# Patient Record
Sex: Female | Born: 1995 | Race: White | Hispanic: No | Marital: Single | Smoking: Never smoker
Health system: Southern US, Community
[De-identification: ages and names within clinical notes are randomized; demographics above are authoritative.]

## PROBLEM LIST (undated history)

## (undated) DIAGNOSIS — E039 Hypothyroidism, unspecified: Secondary | ICD-10-CM

## (undated) DIAGNOSIS — E049 Nontoxic goiter, unspecified: Secondary | ICD-10-CM

## (undated) DIAGNOSIS — K589 Irritable bowel syndrome without diarrhea: Secondary | ICD-10-CM

## (undated) DIAGNOSIS — N8300 Follicular cyst of ovary, unspecified side: Secondary | ICD-10-CM

## (undated) DIAGNOSIS — N390 Urinary tract infection, site not specified: Secondary | ICD-10-CM

## (undated) DIAGNOSIS — N200 Calculus of kidney: Secondary | ICD-10-CM

## (undated) DIAGNOSIS — N912 Amenorrhea, unspecified: Secondary | ICD-10-CM

## (undated) DIAGNOSIS — IMO0002 Reserved for concepts with insufficient information to code with codable children: Secondary | ICD-10-CM

## (undated) DIAGNOSIS — N809 Endometriosis, unspecified: Secondary | ICD-10-CM

## (undated) DIAGNOSIS — I7300 Raynaud's syndrome without gangrene: Secondary | ICD-10-CM

## (undated) DIAGNOSIS — N920 Excessive and frequent menstruation with regular cycle: Secondary | ICD-10-CM

## (undated) DIAGNOSIS — R1310 Dysphagia, unspecified: Secondary | ICD-10-CM

## (undated) DIAGNOSIS — G8929 Other chronic pain: Secondary | ICD-10-CM

## (undated) DIAGNOSIS — R011 Cardiac murmur, unspecified: Secondary | ICD-10-CM

## (undated) DIAGNOSIS — R55 Syncope and collapse: Secondary | ICD-10-CM

## (undated) DIAGNOSIS — E282 Polycystic ovarian syndrome: Secondary | ICD-10-CM

## (undated) DIAGNOSIS — R102 Pelvic and perineal pain: Secondary | ICD-10-CM

## (undated) HISTORY — DX: Nontoxic goiter, unspecified: E04.9

## (undated) HISTORY — DX: Urinary tract infection, site not specified: N39.0

## (undated) HISTORY — DX: Endometriosis, unspecified: N80.9

## (undated) HISTORY — DX: Reserved for concepts with insufficient information to code with codable children: IMO0002

## (undated) HISTORY — DX: Cardiac murmur, unspecified: R01.1

## (undated) HISTORY — DX: Syncope and collapse: R55

## (undated) HISTORY — DX: Polycystic ovarian syndrome: E28.2

## (undated) HISTORY — DX: Follicular cyst of ovary, unspecified side: N83.00

## (undated) HISTORY — DX: Other chronic pain: G89.29

## (undated) HISTORY — DX: Pelvic and perineal pain: R10.2

## (undated) HISTORY — DX: Raynaud's syndrome without gangrene: I73.00

## (undated) HISTORY — PX: CHOLECYSTECTOMY: SHX55

## (undated) HISTORY — DX: Amenorrhea, unspecified: N91.2

## (undated) HISTORY — DX: Dysphagia, unspecified: R13.10

## (undated) HISTORY — PX: WISDOM TOOTH EXTRACTION: SHX21

## (undated) HISTORY — DX: Excessive and frequent menstruation with regular cycle: N92.0

## (undated) HISTORY — DX: Calculus of kidney: N20.0

---
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
## 2015-01-09 ENCOUNTER — Ambulatory Visit: Payer: Self-pay | Admitting: Internal Medicine

## 2015-03-14 ENCOUNTER — Other Ambulatory Visit: Payer: Self-pay | Admitting: Family Medicine

## 2015-03-14 DIAGNOSIS — K829 Disease of gallbladder, unspecified: Secondary | ICD-10-CM

## 2015-03-21 ENCOUNTER — Other Ambulatory Visit
Admission: RE | Admit: 2015-03-21 | Discharge: 2015-03-21 | Disposition: A | Discharge status: Home / Self Care | Payer: 59 | Source: Ambulatory Visit | Attending: Family Medicine | Admitting: Family Medicine

## 2015-03-21 DIAGNOSIS — Z3200 Encounter for pregnancy test, result unknown: Secondary | ICD-10-CM | POA: Insufficient documentation

## 2015-03-21 LAB — HCG, QUANTITATIVE, PREGNANCY

## 2015-03-22 ENCOUNTER — Ambulatory Visit
Admission: RE | Admit: 2015-03-22 | Discharge: 2015-03-22 | Disposition: A | Discharge status: Home / Self Care | Payer: 59 | Source: Ambulatory Visit | Attending: Family Medicine | Admitting: Family Medicine

## 2015-03-22 DIAGNOSIS — K829 Disease of gallbladder, unspecified: Secondary | ICD-10-CM | POA: Insufficient documentation

## 2015-03-22 MED ORDER — SINCALIDE 5 MCG IJ SOLR
0.0200 ug/kg | Freq: Once | INTRAMUSCULAR | Status: AC
  Administered 2015-03-22: 1.4 ug via INTRAVENOUS
  Filled 2015-03-22: qty 5

## 2015-03-22 MED ORDER — TECHNETIUM TC 99M MEBROFENIN IV KIT
5.3780 | PACK | Freq: Once | INTRAVENOUS | Status: AC | PRN
  Administered 2015-03-22: 5.378 via INTRAVENOUS

## 2015-04-05 ENCOUNTER — Emergency Department
Admission: EM | Admit: 2015-04-05 | Discharge: 2015-04-05 | Disposition: A | Discharge status: Home / Self Care | Payer: 59 | Attending: Emergency Medicine | Admitting: Emergency Medicine

## 2015-04-05 ENCOUNTER — Encounter: Payer: Self-pay | Admitting: Emergency Medicine

## 2015-04-05 ENCOUNTER — Other Ambulatory Visit: Payer: Self-pay

## 2015-04-05 DIAGNOSIS — G8929 Other chronic pain: Secondary | ICD-10-CM | POA: Insufficient documentation

## 2015-04-05 DIAGNOSIS — R079 Chest pain, unspecified: Secondary | ICD-10-CM | POA: Diagnosis present

## 2015-04-05 DIAGNOSIS — M940 Chondrocostal junction syndrome [Tietze]: Secondary | ICD-10-CM | POA: Insufficient documentation

## 2015-04-05 HISTORY — DX: Hypothyroidism, unspecified: E03.9

## 2015-04-05 LAB — TROPONIN I: Troponin I: <0.03 ng/mL (ref <0.031)

## 2015-04-05 LAB — BASIC METABOLIC PANEL
Anion gap: 10 (ref 5–15)
BUN: 11 mg/dL (ref 6–20)
CHLORIDE: 105 mmol/L (ref 101–111)
CO2: 25 mmol/L (ref 22–32)
Calcium: 9.5 mg/dL (ref 8.9–10.3)
Creatinine, Ser: 0.85 mg/dL (ref 0.44–1.00)
GFR calc Af Amer: >60 mL/min (ref >60)
GFR calc non Af Amer: >60 mL/min (ref >60)
Glucose, Bld: 94 mg/dL (ref 65–99)
POTASSIUM: 3.8 mmol/L (ref 3.5–5.1)
SODIUM: 140 mmol/L (ref 135–145)

## 2015-04-05 LAB — CBC
HCT: 42.1 % (ref 35.0–47.0)
Hemoglobin: 14.1 g/dL (ref 12.0–16.0)
MCH: 30.1 pg (ref 26.0–34.0)
MCHC: 33.4 g/dL (ref 32.0–36.0)
MCV: 90.1 fL (ref 80.0–100.0)
PLATELETS: 309 10*3/uL (ref 150–440)
RBC: 4.67 MIL/uL (ref 3.80–5.20)
RDW: 12.1 % (ref 11.5–14.5)
WBC: 8.6 10*3/uL (ref 3.6–11.0)

## 2015-04-05 NOTE — ED Notes (Signed)
MD at bedside. 

## 2015-04-05 NOTE — ED Notes (Signed)
PT in with co cp since this am with dizziness, recent dx of hypothyroidism was started on synthroid 4 weeks ago.

## 2015-04-05 NOTE — Discharge Instructions (Signed)
Your EKG, chest x-ray, and labs were reassuring today.  We believe your chest discomfort is caused by costochondritis.  Please read the included information and follow-up with your regular doctor as recommended.  She has been told not to take ibuprofen or related medications in the past (NSAIDs), try taking ibuprofen 600 mg by mouth 3 times a day with meals for 5 days.  This should help with the pain and inflammation.  Return to the emergency department with new or worsening symptoms that concern you.  Costochondritis Costochondritis is a condition in which the tissue (cartilage) that connects your ribs with your breastbone (sternum) becomes irritated. It causes pain in the chest and rib area. It usually goes away on its own over time. HOME CARE  Avoid activities that wear you out.  Do not strain your ribs. Avoid activities that use your:  Chest.  Belly.  Side muscles.  Put ice on the area for the first 2 days after the pain starts.  Put ice in a plastic bag.  Place a towel between your skin and the bag.  Leave the ice on for 20 minutes, 2-3 times a day.  Only take medicine as told by your doctor. GET HELP IF:  You have redness or puffiness (swelling) in the rib area.  Your pain does not go away with rest or medicine. GET HELP RIGHT AWAY IF:   Your pain gets worse.  You are very uncomfortable.  You have trouble breathing.  You cough up blood.  You start sweating or throwing up (vomiting).  You have a fever or lasting symptoms for more than 2-3 days.  You have a fever and your symptoms suddenly get worse. MAKE SURE YOU:   Understand these instructions.  Will watch your condition.  Will get help right away if you are not doing well or get worse. Document Released: 04/07/2008 Document Revised: 06/22/2013 Document Reviewed: 05/24/2013 [facility] Patient Information 2015 [place], [place]. This information is not intended to replace advice given to you by your health  care provider. Make sure you discuss any questions you have with your health care provider.  Chest Pain (Nonspecific) It is often hard to give a specific diagnosis for the cause of chest pain. There is always a chance that your pain could be related to something serious, such as a heart attack or a blood clot in the lungs. You need to follow up with your health care provider for further evaluation. CAUSES   Heartburn.  Pneumonia or bronchitis.  Anxiety or stress.  Inflammation around your heart (pericarditis) or lung (pleuritis or pleurisy).  A blood clot in the lung.  A collapsed lung (pneumothorax). It can develop suddenly on its own (spontaneous pneumothorax) or from trauma to the chest.  Shingles infection (herpes zoster virus). The chest wall is composed of bones, muscles, and cartilage. Any of these can be the source of the pain.  The bones can be bruised by injury.  The muscles or cartilage can be strained by coughing or overwork.  The cartilage can be affected by inflammation and become sore (costochondritis). DIAGNOSIS  Lab tests or other studies may be needed to find the cause of your pain. Your health care provider may have you take a test called an ambulatory electrocardiogram (ECG). An ECG records your heartbeat patterns over a 24-hour period. You may also have other tests, such as:  Transthoracic echocardiogram (TTE). During echocardiography, sound waves are used to evaluate how blood flows through your heart.  Transesophageal echocardiogram (  TEE).  Cardiac monitoring. This allows your health care provider to monitor your heart rate and rhythm in real time.  Holter monitor. This is a portable device that records your heartbeat and can help diagnose heart arrhythmias. It allows your health care provider to track your heart activity for several days, if needed.  Stress tests by exercise or by giving medicine that makes the heart beat faster. TREATMENT   Treatment  depends on what may be causing your chest pain. Treatment may include:  Acid blockers for heartburn.  Anti-inflammatory medicine.  Pain medicine for inflammatory conditions.  Antibiotics if an infection is present.  You may be advised to change lifestyle habits. This includes stopping smoking and avoiding alcohol, caffeine, and chocolate.  You may be advised to keep your head raised (elevated) when sleeping. This reduces the chance of acid going backward from your stomach into your esophagus. Most of the time, nonspecific chest pain will improve within 2-3 days with rest and mild pain medicine.  HOME CARE INSTRUCTIONS   If antibiotics were prescribed, take them as directed. Finish them even if you start to feel better.  For the next few days, avoid physical activities that bring on chest pain. Continue physical activities as directed.  Do not use any tobacco products, including cigarettes, chewing tobacco, or electronic cigarettes.  Avoid drinking alcohol.  Only take medicine as directed by your health care provider.  Follow your health care provider's suggestions for further testing if your chest pain does not go away.  Keep any follow-up appointments you made. If you do not go to an appointment, you could develop lasting (chronic) problems with pain. If there is any problem keeping an appointment, call to reschedule. SEEK MEDICAL CARE IF:   Your chest pain does not go away, even after treatment.  You have a rash with blisters on your chest.  You have a fever. SEEK IMMEDIATE MEDICAL CARE IF:   You have increased chest pain or pain that spreads to your arm, neck, jaw, back, or abdomen.  You have shortness of breath.  You have an increasing cough, or you cough up blood.  You have severe back or abdominal pain.  You feel nauseous or vomit.  You have severe weakness.  You faint.  You have chills. This is an emergency. Do not wait to see if the pain will go away. Get  medical help at once. Call your local emergency services (911 in [place] Do not drive yourself to the hospital. MAKE SURE YOU:   Understand these instructions.  Will watch your condition.  Will get help right away if you are not doing well or get worse. Document Released: 07/30/2005 Document Revised: 10/25/2013 Document Reviewed: 05/25/2008 [facility] Patient Information 2015 [place]. This information is not intended to replace advice given to you by your health care provider. Make sure you discuss any questions you have with your health care provider.

## 2015-04-05 NOTE — ED Provider Notes (Signed)
[facility] Emergency Department Provider Note  ____________________________________________  Time seen: Approximately 9:42 PM  I have reviewed the triage vital signs and the nursing notes.   HISTORY  Chief Complaint Chest Pain    HPI Victoria Becker is a 19 y.o. female with a history of hypothyroidism, chronic right upper quadrant abdominal pain with recent HIDA scan, and obesity who presents with sharp substernal chest pain since this morning.  She states that she has had these symptoms many times over the last 3 or 4 weeks.  She thought she should get it checked out today.  It is reproducible with palpation of the sternum.  She denies shortness of breath, nausea/vomiting, dysuria, and any bowel changes as well as fever/chills.  He describes the pain as severe when it is "acting up" and mild at baseline.   Past Medical History  Diagnosis Date  . Hypothyroidism     There are no active problems to display for this patient.   Past Surgical History  Procedure Laterality Date  . Appendectomy  10/2013    No current outpatient prescriptions on file.  Allergies Augmentin  History reviewed. No pertinent family history.  Social History History  Substance Use Topics  . Smoking status: Never Smoker   . Smokeless tobacco: Not on file  . Alcohol Use: No    Review of Systems Constitutional: No fever/chills Eyes: No visual changes. ENT: No sore throat. Cardiovascular: Substernal chest pain as described above Respiratory: Denies shortness of breath. Gastrointestinal: No abdominal pain.  No nausea, no vomiting.  No diarrhea.  No constipation. Genitourinary: Negative for dysuria. Musculoskeletal: Negative for back pain. Skin: Negative for rash. Neurological: Negative for headaches, focal weakness or numbness.  10-point ROS otherwise negative.  ____________________________________________   PHYSICAL EXAM:  VITAL SIGNS: ED Triage Vitals  Enc Vitals  Group     BP 04/05/15 2016 136/86 mmHg     Pulse Rate 04/05/15 2016 99     Resp 04/05/15 2016 18     Temp 04/05/15 2016 98.1 F (36.7 C)     Temp Source 04/05/15 2016 Oral     SpO2 04/05/15 2016 100 %     Weight 04/05/15 2016 150 lb (68.04 kg)     Height 04/05/15 2016  (1.448 m)     Head Cir --      Peak Flow --      Pain Score 04/05/15 2017 8     Pain Loc --      Pain Edu? --      Excl. in GC? --     Constitutional: Alert and oriented. Well appearing and in no acute distress. Eyes: Conjunctivae are normal. PERRL. EOMI. Head: Atraumatic. Nose: No congestion/rhinnorhea. Mouth/Throat: Mucous membranes are moist.  Oropharynx non-erythematous. Neck: No stridor.  Cardiovascular: Normal rate, regular rhythm. Grossly normal heart sounds.  Good peripheral circulation.  Reproducible left-sided sternal/costochondral chest tenderness. Respiratory: Normal respiratory effort.  No retractions. Lungs CTAB. Gastrointestinal: Soft and nontender. No distention. No abdominal bruits. No CVA tenderness. Musculoskeletal: No lower extremity tenderness nor edema.  No joint effusions. Neurologic:  Normal speech and language. No gross focal neurologic deficits are appreciated. Speech is normal. No gait instability. Skin:  Skin is warm, dry and intact. No rash noted. Psychiatric: Mood and affect are normal. Speech and behavior are normal.  ____________________________________________   LABS (all labs ordered are listed, but only abnormal results are displayed)  Labs Reviewed  CBC  BASIC METABOLIC PANEL  TROPONIN I  lab results are unremarkable ____________________________________________  EKG  ED ECG REPORT I, Debbe Crumble, the attending physician, personally viewed and interpreted this ECG.  Date: 04/05/2015 EKG Time: 20:28  Rate: 98 Rhythm: normal sinus rhythm QRS Axis: normal Intervals: normal ST/T Wave abnormalities: normal Conduction Disutrbances: none Narrative  Interpretation: unremarkable  ____________________________________________  RADIOLOGY (Recent study, not ordered today) Nm Hepato W/eject Fract  03/22/2015   CLINICAL DATA:  Three months of abdominal pain with associated nausea and fever ; history of appendectomy  EXAM: NUCLEAR MEDICINE HEPATOBILIARY IMAGING WITH GALLBLADDER EF  TECHNIQUE: Sequential images of the abdomen were obtained out to 60 minutes following intravenous administration of radiopharmaceutical. After slow intravenous infusion of 1.4 micrograms Cholecystokinin, gallbladder ejection fraction was determined.  RADIOPHARMACEUTICALS:  5.378 mCi Technetium-3844m Choletec IV  COMPARISON:  Right upper quadrant ultrasound of December 21, 2012  FINDINGS: There is adequate uptake of the radiopharmaceutical by the liver. The common bile duct visible by 10-15 minutes with bowel activity visible by 15 minutes. The gallbladder is clearly visible by 20 minutes. Following CCK administration the 45 minutes ejection fraction is 75%. No pain was reported with CCK administration. At 45 min, normal ejection fraction is greater than 40%.  IMPRESSION: Normal hepatobiliary scan with normal gallbladder ejection fraction.   Electronically Signed   By: David  SwazilandJordan M.D.   On: 03/22/2015 10:00    ____________________________________________   INITIAL IMPRESSION / ASSESSMENT AND PLAN / ED COURSE  Pertinent labs & imaging results that were available during my care of the patient were reviewed by me and considered in my medical decision making (see chart for details).  The patient's exam, history, and results are all reassuring that this does not represent ACS.  She is PERC negative.  We discussed costochondritis and recommended close outpatient follow-up.  She understands and agrees.  I gave my usual and customary return precautions.  ____________________________________________  FINAL CLINICAL IMPRESSION(S) / ED DIAGNOSES  Final diagnoses:  Acute  costochondritis      NEW MEDICATIONS STARTED DURING THIS VISIT:  There are no discharge medications for this patient.    Loleta Roseory Jette Lewan, MD 04/05/15 2314

## 2015-04-05 NOTE — ED Notes (Signed)
Pt presents with chest pain since this morning. Recently started on levothyroxine. Pt holding her chest and appears uncomfortable.

## 2015-07-31 ENCOUNTER — Encounter: Payer: Self-pay | Admitting: Obstetrics and Gynecology

## 2015-11-19 ENCOUNTER — Emergency Department: Payer: 59

## 2015-11-19 ENCOUNTER — Emergency Department
Admission: EM | Admit: 2015-11-19 | Discharge: 2015-11-19 | Disposition: A | Discharge status: Home / Self Care | Payer: 59 | Attending: Emergency Medicine | Admitting: Emergency Medicine

## 2015-11-19 ENCOUNTER — Encounter: Payer: Self-pay | Admitting: *Deleted

## 2015-11-19 DIAGNOSIS — R000 Tachycardia, unspecified: Secondary | ICD-10-CM | POA: Diagnosis not present

## 2015-11-19 DIAGNOSIS — R0602 Shortness of breath: Secondary | ICD-10-CM | POA: Diagnosis not present

## 2015-11-19 DIAGNOSIS — R0789 Other chest pain: Secondary | ICD-10-CM | POA: Diagnosis not present

## 2015-11-19 DIAGNOSIS — Z91040 Latex allergy status: Secondary | ICD-10-CM | POA: Diagnosis not present

## 2015-11-19 DIAGNOSIS — R109 Unspecified abdominal pain: Secondary | ICD-10-CM | POA: Diagnosis present

## 2015-11-19 LAB — URINALYSIS COMPLETE WITH MICROSCOPIC (ARMC ONLY)
BILIRUBIN URINE: NEGATIVE
Glucose, UA: NEGATIVE mg/dL
Hgb urine dipstick: NEGATIVE
LEUKOCYTES UA: NEGATIVE
Nitrite: NEGATIVE
PROTEIN: 30 mg/dL — AB
SPECIFIC GRAVITY, URINE: 1.027 (ref 1.005–1.030)
pH: 7 (ref 5.0–8.0)

## 2015-11-19 LAB — BASIC METABOLIC PANEL
ANION GAP: 7 (ref 5–15)
BUN: 8 mg/dL (ref 6–20)
CHLORIDE: 104 mmol/L (ref 101–111)
CO2: 29 mmol/L (ref 22–32)
Calcium: 10 mg/dL (ref 8.9–10.3)
Creatinine, Ser: 0.68 mg/dL (ref 0.44–1.00)
GFR calc Af Amer: >60 mL/min (ref >60)
GLUCOSE: 122 mg/dL — AB (ref 65–99)
Potassium: 3.8 mmol/L (ref 3.5–5.1)
Sodium: 140 mmol/L (ref 135–145)

## 2015-11-19 LAB — CBC WITH DIFFERENTIAL/PLATELET
BASOS ABS: 0 10*3/uL (ref 0–0.1)
Basophils Relative: 0 %
EOS PCT: 2 %
Eosinophils Absolute: 0.1 10*3/uL (ref 0–0.7)
HEMATOCRIT: 43.7 % (ref 35.0–47.0)
Hemoglobin: 14.9 g/dL (ref 12.0–16.0)
Lymphocytes Relative: 29 %
Lymphs Abs: 1.7 10*3/uL (ref 1.0–3.6)
MCH: 30.2 pg (ref 26.0–34.0)
MCHC: 34 g/dL (ref 32.0–36.0)
MCV: 88.8 fL (ref 80.0–100.0)
MONO ABS: 0.6 10*3/uL (ref 0.2–0.9)
Monocytes Relative: 10 %
NEUTROS ABS: 3.5 10*3/uL (ref 1.4–6.5)
Neutrophils Relative %: 59 %
Platelets: 286 10*3/uL (ref 150–440)
RBC: 4.92 MIL/uL (ref 3.80–5.20)
RDW: 12.4 % (ref 11.5–14.5)
WBC: 5.9 10*3/uL (ref 3.6–11.0)

## 2015-11-19 LAB — POCT PREGNANCY, URINE: Preg Test, Ur: NEGATIVE

## 2015-11-19 MED ORDER — KETOROLAC TROMETHAMINE 30 MG/ML IJ SOLN
30.0000 mg | Freq: Once | INTRAMUSCULAR | Status: AC
  Administered 2015-11-19: 30 mg via INTRAVENOUS

## 2015-11-19 MED ORDER — IOHEXOL 350 MG/ML SOLN
100.0000 mL | Freq: Once | INTRAVENOUS | Status: AC | PRN
  Administered 2015-11-19: 75 mL via INTRAVENOUS

## 2015-11-19 MED ORDER — KETOROLAC TROMETHAMINE 30 MG/ML IJ SOLN
INTRAMUSCULAR | Status: AC
  Filled 2015-11-19: qty 1

## 2015-11-19 MED ORDER — NAPROXEN 500 MG PO TABS: 500.0000 mg | ORAL_TABLET | Freq: Two times a day (BID) | ORAL | Status: DC

## 2015-11-19 MED ORDER — LORAZEPAM 2 MG/ML IJ SOLN
INTRAMUSCULAR | Status: AC
  Administered 2015-11-19: 0.5 mg via INTRAVENOUS
  Filled 2015-11-19: qty 1

## 2015-11-19 MED ORDER — LORAZEPAM 2 MG/ML IJ SOLN
0.5000 mg | Freq: Once | INTRAMUSCULAR | Status: AC
  Administered 2015-11-19: 0.5 mg via INTRAVENOUS

## 2015-11-19 MED ORDER — SODIUM CHLORIDE 0.9 % IV BOLUS (SEPSIS)
1000.0000 mL | Freq: Once | INTRAVENOUS | Status: AC
  Administered 2015-11-19: 1000 mL via INTRAVENOUS

## 2015-11-19 NOTE — Discharge Instructions (Signed)

## 2015-11-19 NOTE — ED Notes (Signed)
Pt leaning forward in wheelchair, states that she is hurting under her rt breast and in her mid/upper back on the rt side, pt states that the pain doesn't remind her of her past kidney stones, pt states that it does hurt to breathe, pt asked if she has been coughing or congested recently, pt states that she just got over pneumonia a few weeks ago and states that it took 3 antibiotics in order to clear it. Pt appears very uncomfortable, charge nurse called for a room

## 2015-11-19 NOTE — ED Notes (Signed)
Pt discharged to home.  Family member driving.  Discharge instructions reviewed.  Verbalized understanding.  No questions or concerns at this time.  Teach back verified.  Pt in NAD.  No items left in ED.   

## 2015-11-19 NOTE — ED Provider Notes (Signed)
[facility] Emergency Department Provider Note  ____________________________________________  Time seen: 1:15 AM  I have reviewed the triage vital signs and the nursing notes.   HISTORY  Chief Complaint Flank Pain    HPI Victoria Becker is a 20 y.o. female who complains of right sided pain since yesterday afternoon. He was rapid onset, constant since then. Sharp and achy. Feels it in the right lower rib area. Feels like possibly a kidney stone. Also feels short of breath and hurts worse with breathing. He is taking hormonal medication for PCO S. Family history of DVT. Nonsmoker.Pain is not exertional.     Past Medical History  Diagnosis Date  . Hypothyroidism   . Raynaud disease   . UTI (lower urinary tract infection)   . Kidney stone   . Heart murmur   . Follicular cyst of ovary   . PCOS (polycystic ovarian syndrome)   . Chronic pelvic pain in female   . Dysphagia   . Heavy periods   . Amenorrhea   . Pelvic pain in female   . Dyspareunia   . Endometriosis   . Syncope   . Enlarged thyroid      There are no active problems to display for this patient.    Past Surgical History  Procedure Laterality Date  . Appendectomy  10/2013  . Colon surgery  08/2013     Current Outpatient Rx  Name  Route  Sig  Dispense  Refill  . Biotin 10 MG CAPS   Oral   Take by mouth.         . naproxen (NAPROSYN) 500 MG tablet   Oral   Take 1 tablet (500 mg total) by mouth 2 (two) times daily with a meal.   20 tablet   0   . norgestimate-ethinyl estradiol (SPRINTEC 28) 0.25-35 MG-MCG tablet   Oral   Take by mouth.            Allergies Augmentin and Latex   Family History  Problem Relation Age of Onset  . Cancer Neg Hx   . Heart disease Neg Hx     Social History Social History  Substance Use Topics  . Smoking status: Never Smoker   . Smokeless tobacco: None  . Alcohol Use: No    Review of Systems  Constitutional:   No fever or  chills. No weight changes Eyes:   No blurry vision or double vision.  ENT:   No sore throat. Cardiovascular:   Positive chest pain. Respiratory:   Positive shortness of breath without cough Gastrointestinal:   Negative for abdominal pain, vomiting and diarrhea.  No BRBPR or melena. Genitourinary:   Negative for dysuria, urinary retention, bloody urine, or difficulty urinating. Musculoskeletal:   Negative for back pain. No joint swelling or pain. Skin:   Negative for rash. Neurological:   Negative for headaches, focal weakness or numbness. Psychiatric:  No anxiety or depression.   Endocrine:  No hot/cold intolerance, changes in energy, or sleep difficulty.  10-point ROS otherwise negative.  ____________________________________________   PHYSICAL EXAM:  VITAL SIGNS: ED Triage Vitals  Enc Vitals Group     BP 11/19/15 0028 93/50 mmHg     Pulse Rate 11/19/15 0028 118     Resp 11/19/15 0028 20     Temp 11/19/15 0028 98 F (36.7 C)     Temp Source 11/19/15 0028 Oral     SpO2 11/19/15 0028 99 %     Weight 11/19/15 0028 135  lb (61.236 kg)     Height 11/19/15 0028 4\' 9"  (1.448 m)     Head Cir --      Peak Flow --      Pain Score 11/19/15 0028 9     Pain Loc --      Pain Edu? --      Excl. in GC? --     Vital signs reviewed, nursing assessments reviewed.   Constitutional:   Alert and oriented. Well appearing and in no distress. Eyes:   No scleral icterus. No conjunctival pallor. PERRL. EOMI ENT   Head:   Normocephalic and atraumatic.   Nose:   No congestion/rhinnorhea. No septal hematoma   Mouth/Throat:   MMM, no pharyngeal erythema. No peritonsillar mass. No uvula shift.   Neck:   No stridor. No SubQ emphysema. No meningismus. Hematological/Lymphatic/Immunilogical:   No cervical lymphadenopathy. Cardiovascular:   Tachycardia heart rate 110. Normal and symmetric distal pulses are present in all extremities. No murmurs, rubs, or gallops. Respiratory:   Normal  respiratory effort without tachypnea nor retractions. Breath sounds are clear and equal bilaterally. No wheezes/rales/rhonchi. Gastrointestinal:   Soft and nontender. No distention. There is no CVA tenderness.  No rebound, rigidity, or guarding. Genitourinary:   deferred Musculoskeletal:   Nontender with normal range of motion in all extremities. No joint effusions.  No lower extremity tenderness.  No edema. Right inferolateral chest wall tender to palpation which reproduces her symptoms Neurologic:   Normal speech and language.  CN 2-10 normal. Motor grossly intact. No pronator drift.  Normal gait. No gross focal neurologic deficits are appreciated.  Skin:    Skin is warm, dry and intact. No rash noted.  No petechiae, purpura, or bullae. Psychiatric:   Mood and affect are normal. Speech and behavior are normal. Patient exhibits appropriate insight and judgment.  ____________________________________________    LABS (pertinent positives/negatives) (all labs ordered are listed, but only abnormal results are displayed) Labs Reviewed  URINALYSIS COMPLETEWITH MICROSCOPIC ([facility] ONLY) - Abnormal; Notable for the following:    Color, Urine AMBER (*)    APPearance TURBID (*)    Ketones, ur TRACE (*)    Protein, ur 30 (*)    Bacteria, UA RARE (*)    Squamous Epithelial / LPF 6-30 (*)    All other components within normal limits  BASIC METABOLIC PANEL - Abnormal; Notable for the following:    Glucose, Bld 122 (*)    All other components within normal limits  CBC WITH DIFFERENTIAL/PLATELET  POC URINE PREG, ED  POCT PREGNANCY, URINE   ____________________________________________   EKG  Interpreted by me Sinus rhythm rate of 95. Normal axis and intervals. Normal QRS ST segments and T waves. There is an S1 Q3 T3 pattern which is new compared to 04/05/2015.  ____________________________________________    RADIOLOGY  CT angiogram chest  unremarkable  ____________________________________________   PROCEDURES   ____________________________________________   INITIAL IMPRESSION / ASSESSMENT AND PLAN / ED COURSE  Pertinent labs & imaging results that were available during my care of the patient were reviewed by me and considered in my medical decision making (see chart for details).  Patient presents with right chest wall pain. Reproducible on exam, likely musculoskeletal. However with her tachycardia shortness of breath hormone use and family history, and the nature of the symptoms suggestive of PE, we'll get a CT angiogram.    ----------------------------------------- 4:08 AM on 11/19/2015 -----------------------------------------  Workup negative. Heart rate 90. Calm and comfortable after Toradol. Discharge home.  NSAIDs. No microscopic hematuria, low suspicion for ureterolithiasis.     ____________________________________________   FINAL CLINICAL IMPRESSION(S) / ED DIAGNOSES  Final diagnoses:  Acute chest wall pain      Sharman Cheek, MD 11/19/15 0410

## 2015-11-19 NOTE — ED Notes (Signed)
Pt's iv started in left ac, attempt made by previous RN unsuccessful, AC accessed in prep of potential test

## 2015-11-19 NOTE — ED Notes (Signed)
Pt notified this RN of a slight discoloration/bruising under R breast as well.  She states that the color has lightened since she first noticed it around Thanksgiving.  Slight discoloration noted.

## 2015-11-19 NOTE — ED Notes (Signed)
Pt presents w/ c/o on-going flank pain since yesterday afternoon. Pt has hx of kidney stones and has taken ibuprofen 800 mg x 2 in past 8 hrs w/o relief. Pt endorses hematuria at onset of pain.

## 2015-11-21 ENCOUNTER — Ambulatory Visit: Payer: Self-pay | Admitting: General Surgery

## 2016-10-09 IMAGING — CT CT ANGIO CHEST
1 of 2 series · 18 of 30 positions shown · IV contrast (APPLIED)
Comparison: None.

CLINICAL DATA: RIGHT upper back and RIGHT breast pain with dyspnea
beginning last night. Recent pneumonia. History of kidney stones,
hypothyroidism.

EXAM:
CT ANGIOGRAPHY CHEST WITH CONTRAST
TECHNIQUE: Multidetector CT imaging of the chest was performed using the
standard protocol during bolus administration of intravenous
contrast. Multiplanar CT image reconstructions and MIPs were
obtained to evaluate the vascular anatomy.
CONTRAST:  75mL OMNIPAQUE IOHEXOL 350 MG/ML SOLN

[Series 5: pe 1.0 thins · axial · 0.68mm/px · z∈[-250,-42]mm · 18 of 236 slices shown]
[im 14/236  lung]
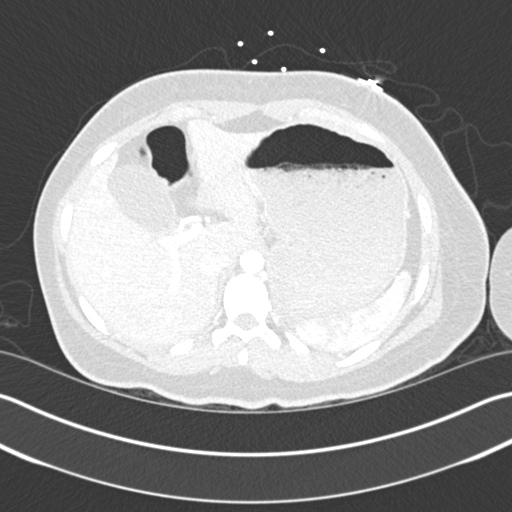
[im 27/236  mediastinal]
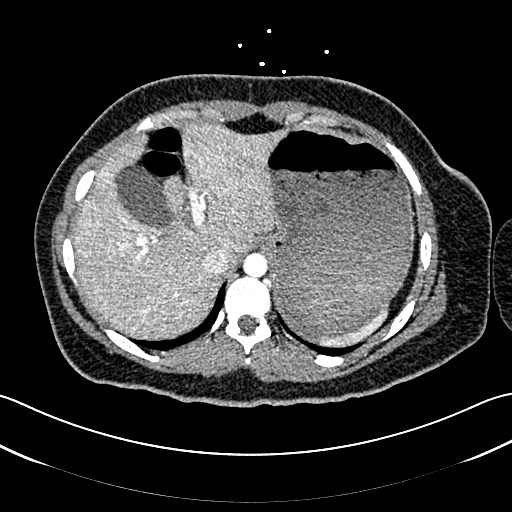
[im 40/236  lung]
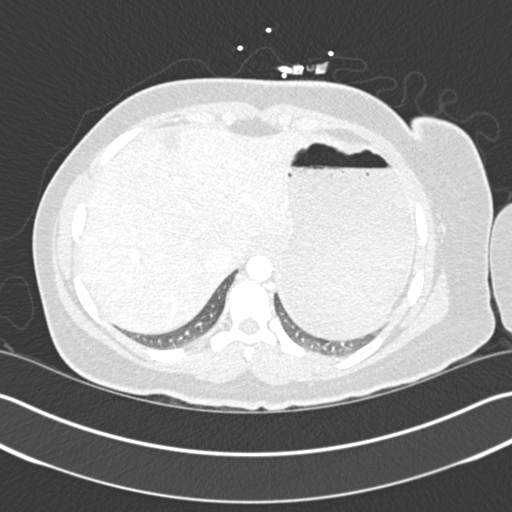
[im 53/236  mediastinal]
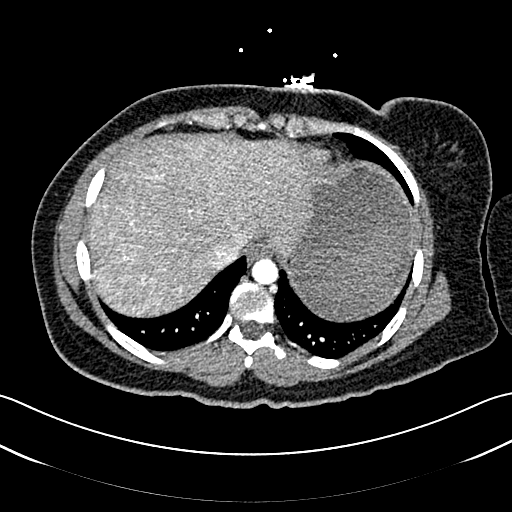
[im 66/236  lung]
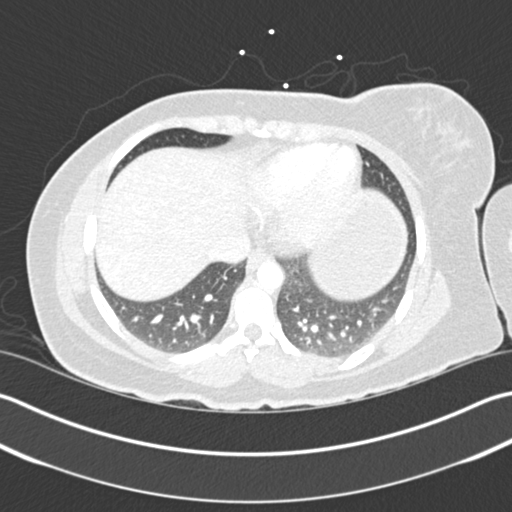
[im 79/236  mediastinal]
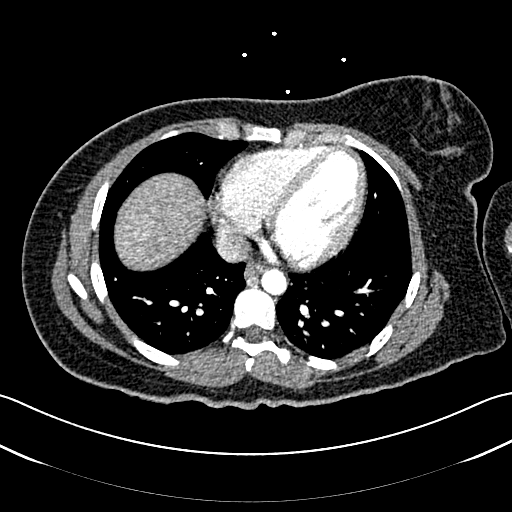
[im 92/236  lung]
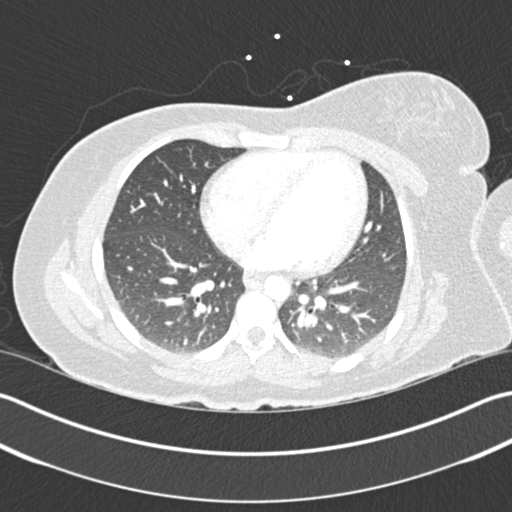
[im 105/236  mediastinal]
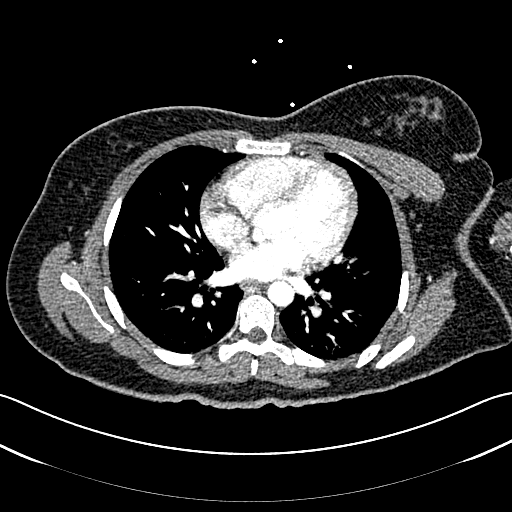
[im 109/236  lung]
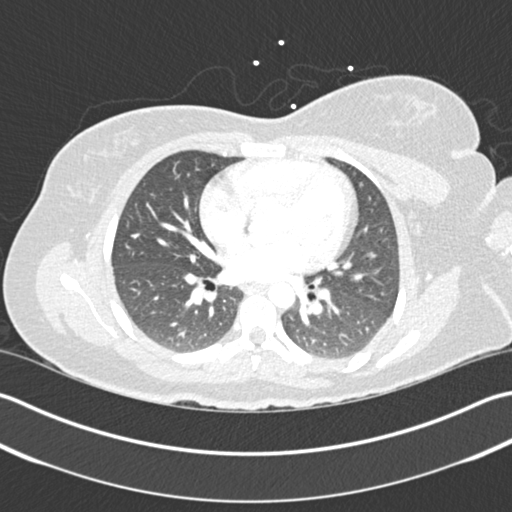
[im 118/236  mediastinal]
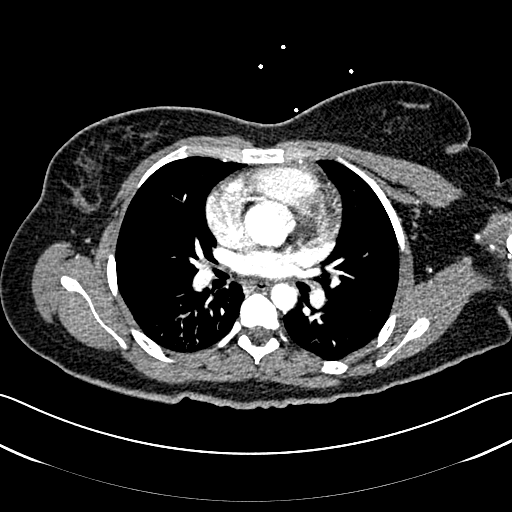
[im 131/236  lung]
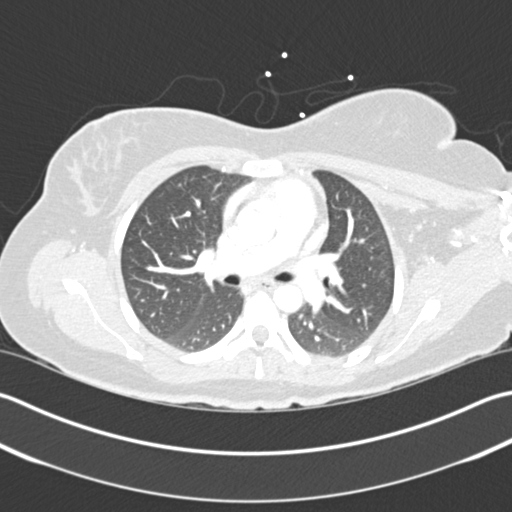
[im 144/236  mediastinal]
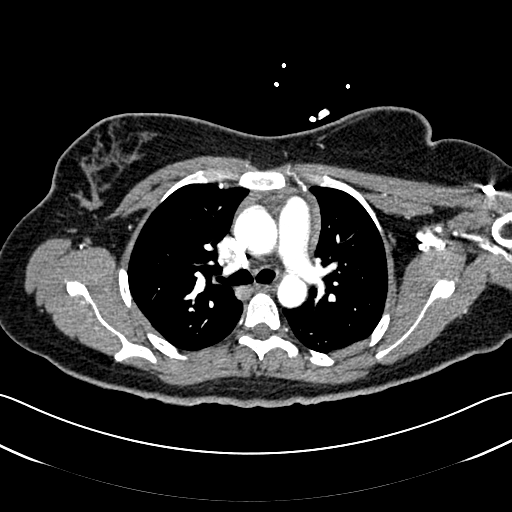
[im 157/236  lung]
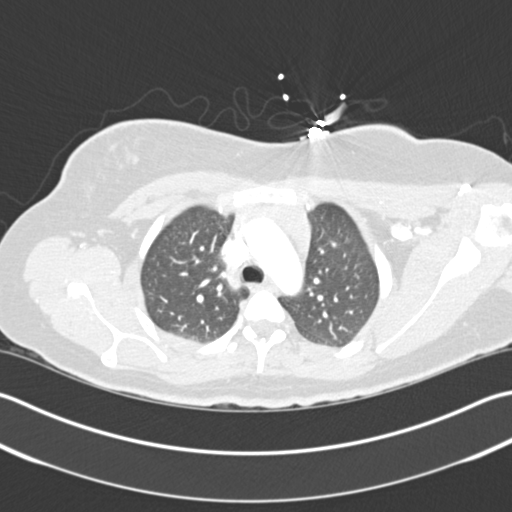
[im 170/236  mediastinal]
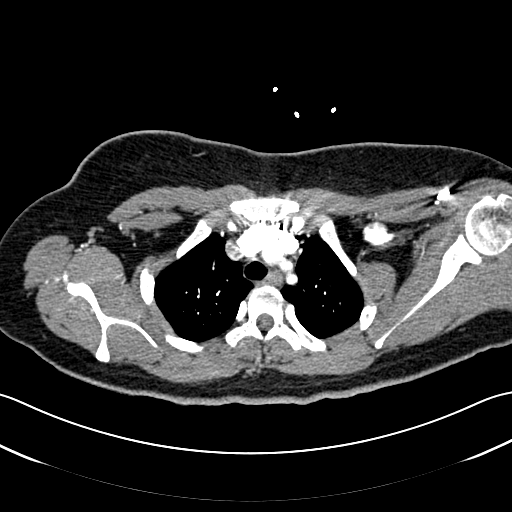
[im 183/236  lung]
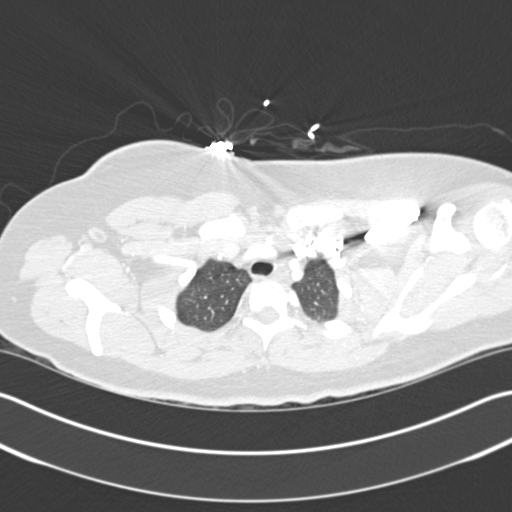
[im 196/236  mediastinal]
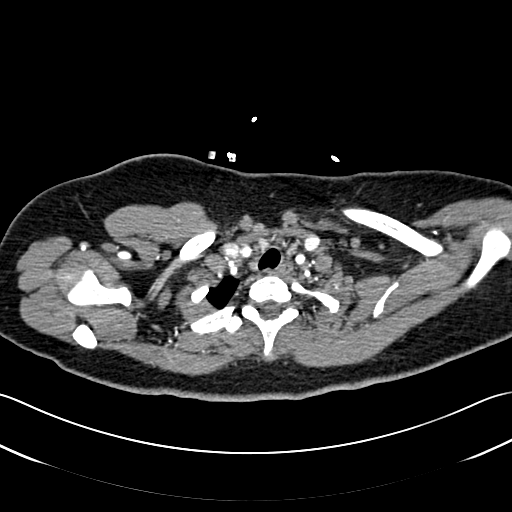
[im 209/236  lung]
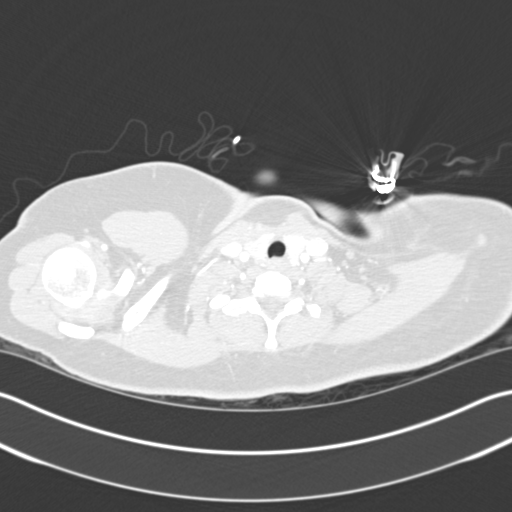
[im 222/236  mediastinal]
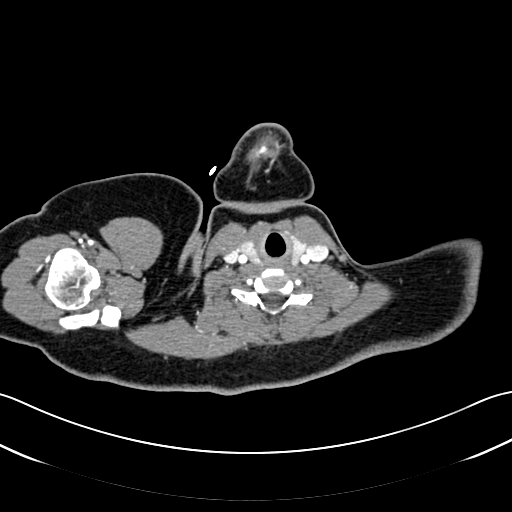

[18 of 30 positions shown; findings below may reference images not displayed]

FINDINGS: PULMONARY ARTERY: Adequate contrast opacification of the pulmonary
artery's. Main pulmonary artery is not enlarged. No pulmonary
arterial filling defects to the level of the subsegmental branches.

MEDIASTINUM: Heart and pericardium are unremarkable, no right heart
strain. Thoracic aorta is normal course and caliber, unremarkable.
Residual thymic tissue noted. No lymphadenopathy by CT size
criteria.

LUNGS: Tracheobronchial tree is patent, no pneumothorax. No pleural
effusions, focal consolidations, pulmonary nodules or masses.

Focal fatty infiltration about the falciform ligament. Debris
distended stomach. Included view of the abdomen is unremarkable.
Visualized soft tissues and included osseous structures appear
normal.

Review of the MIP images confirms the above findings.
IMPRESSION: No acute pulmonary embolism nor acute cardiopulmonary process.

## 2017-10-23 DIAGNOSIS — E063 Autoimmune thyroiditis: Secondary | ICD-10-CM

## 2017-10-23 DIAGNOSIS — E042 Nontoxic multinodular goiter: Secondary | ICD-10-CM | POA: Insufficient documentation

## 2017-10-23 DIAGNOSIS — E038 Other specified hypothyroidism: Secondary | ICD-10-CM | POA: Insufficient documentation

## 2018-08-16 ENCOUNTER — Emergency Department: Payer: Managed Care, Other (non HMO)

## 2018-08-16 ENCOUNTER — Encounter: Payer: Self-pay | Admitting: Emergency Medicine

## 2018-08-16 ENCOUNTER — Emergency Department
Admission: EM | Admit: 2018-08-16 | Discharge: 2018-08-16 | Disposition: A | Discharge status: Home / Self Care | Payer: Managed Care, Other (non HMO) | Attending: Student in an Organized Health Care Education/Training Program | Admitting: Student in an Organized Health Care Education/Training Program

## 2018-08-16 ENCOUNTER — Other Ambulatory Visit: Payer: Self-pay

## 2018-08-16 DIAGNOSIS — Z3401 Encounter for supervision of normal first pregnancy, first trimester: Secondary | ICD-10-CM | POA: Diagnosis not present

## 2018-08-16 DIAGNOSIS — O4691 Antepartum hemorrhage, unspecified, first trimester: Secondary | ICD-10-CM | POA: Insufficient documentation

## 2018-08-16 DIAGNOSIS — Z3A00 Weeks of gestation of pregnancy not specified: Secondary | ICD-10-CM | POA: Insufficient documentation

## 2018-08-16 DIAGNOSIS — Z91040 Latex allergy status: Secondary | ICD-10-CM | POA: Insufficient documentation

## 2018-08-16 DIAGNOSIS — N939 Abnormal uterine and vaginal bleeding, unspecified: Secondary | ICD-10-CM

## 2018-08-16 DIAGNOSIS — R1031 Right lower quadrant pain: Secondary | ICD-10-CM

## 2018-08-16 DIAGNOSIS — O4690 Antepartum hemorrhage, unspecified, unspecified trimester: Secondary | ICD-10-CM

## 2018-08-16 DIAGNOSIS — E039 Hypothyroidism, unspecified: Secondary | ICD-10-CM | POA: Insufficient documentation

## 2018-08-16 LAB — CBC WITH DIFFERENTIAL/PLATELET
ABS IMMATURE GRANULOCYTES: 0.02 10*3/uL (ref 0.00–0.07)
Basophils Absolute: 0 10*3/uL (ref 0.0–0.1)
Basophils Relative: 0 %
Eosinophils Absolute: 0.1 10*3/uL (ref 0.0–0.5)
Eosinophils Relative: 2 %
HCT: 43 % (ref 36.0–46.0)
HEMOGLOBIN: 14.7 g/dL (ref 12.0–15.0)
Immature Granulocytes: 0 %
LYMPHS PCT: 37 %
Lymphs Abs: 2.7 10*3/uL (ref 0.7–4.0)
MCH: 30.1 pg (ref 26.0–34.0)
MCHC: 34.2 g/dL (ref 30.0–36.0)
MCV: 87.9 fL (ref 80.0–100.0)
MONO ABS: 0.6 10*3/uL (ref 0.1–1.0)
Monocytes Relative: 8 %
Neutro Abs: 3.7 10*3/uL (ref 1.7–7.7)
Neutrophils Relative %: 53 %
Platelets: 392 10*3/uL (ref 150–400)
RBC: 4.89 MIL/uL (ref 3.87–5.11)
RDW: 11.7 % (ref 11.5–15.5)
WBC: 7.1 10*3/uL (ref 4.0–10.5)
nRBC: 0 % (ref 0.0–0.2)

## 2018-08-16 LAB — URINALYSIS, COMPLETE (UACMP) WITH MICROSCOPIC
BILIRUBIN URINE: NEGATIVE
GLUCOSE, UA: NEGATIVE mg/dL
Ketones, ur: NEGATIVE mg/dL
LEUKOCYTES UA: NEGATIVE
Nitrite: NEGATIVE
PH: 5 (ref 5.0–8.0)
PROTEIN: 30 mg/dL — AB
Specific Gravity, Urine: 1.026 (ref 1.005–1.030)

## 2018-08-16 LAB — COMPREHENSIVE METABOLIC PANEL
ALT: 31 U/L (ref 0–44)
AST: 27 U/L (ref 15–41)
Albumin: 4.8 g/dL (ref 3.5–5.0)
Alkaline Phosphatase: 71 U/L (ref 38–126)
Anion gap: 9 (ref 5–15)
BILIRUBIN TOTAL: 0.7 mg/dL (ref 0.3–1.2)
BUN: 8 mg/dL (ref 6–20)
CO2: 24 mmol/L (ref 22–32)
CREATININE: 0.61 mg/dL (ref 0.44–1.00)
Calcium: 9.4 mg/dL (ref 8.9–10.3)
Chloride: 103 mmol/L (ref 98–111)
Glucose, Bld: 123 mg/dL — ABNORMAL HIGH (ref 70–99)
Potassium: 3.3 mmol/L — ABNORMAL LOW (ref 3.5–5.1)
Sodium: 136 mmol/L (ref 135–145)
TOTAL PROTEIN: 7.9 g/dL (ref 6.5–8.1)

## 2018-08-16 LAB — ABO/RH: ABO/RH(D): O POS

## 2018-08-16 LAB — HCG, QUANTITATIVE, PREGNANCY: HCG, BETA CHAIN, QUANT, S: 27799 m[IU]/mL — AB (ref <5)

## 2018-08-16 LAB — POCT PREGNANCY, URINE: Preg Test, Ur: POSITIVE — AB

## 2018-08-16 MED ORDER — ACETAMINOPHEN 500 MG PO TABS
1000.0000 mg | ORAL_TABLET | Freq: Once | ORAL | Status: AC
  Administered 2018-08-16: 1000 mg via ORAL
  Filled 2018-08-16: qty 2

## 2018-08-16 NOTE — ED Notes (Signed)
Patient transported to Ultrasound 

## 2018-08-16 NOTE — ED Provider Notes (Addendum)
[facility] Emergency Department Provider Note    First MD Initiated Contact with Patient 08/16/18 2049     (approximate)  I have reviewed the triage vital signs and the nursing notes.   HISTORY  Chief Complaint Vaginal Bleeding    HPI Victoria Becker is a 22 y.o. female with a history of PCOS presents to the ER for evaluation of vaginal bleeding in early pregnancy.  Also having some right-sided leg pain associated with this.  States that her and her husband were having intercourse with the 45 minutes prior to arrival when she started having worsening pain and started noticing vaginal bleeding.  States that the bleeding is mild to moderate.  Has not passed any clots.  Is not soaking through more than 1 tampon or pad per hour.  Denies any history of bleeding disorders.  Has had back pain and flank pain for the past week prior to the vaginal bleeding.  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  Denies any fevers or dysuria.  Last menstrual cycle was the middle of August    Past Medical History:  Diagnosis Date  . Amenorrhea   . Chronic pelvic pain in female   . Dyspareunia   . Dysphagia   . Endometriosis   . Enlarged thyroid   . Follicular cyst of ovary   . Heart murmur   . Heavy periods   . Hypothyroidism   . Kidney stone   . PCOS (polycystic ovarian syndrome)   . Pelvic pain in female   . Raynaud disease   . Syncope   . UTI (lower urinary tract infection)    Family History  Problem Relation Age of Onset  . Cancer Neg Hx   . Heart disease Neg Hx    Past Surgical History:  Procedure Laterality Date  . APPENDECTOMY  10/2013  . CHOLECYSTECTOMY    . COLON SURGERY  08/2013   There are no active problems to display for this patient.     Prior to Admission medications   Medication Sig Start Date End Date Taking? Authorizing Provider  Biotin 10 MG CAPS Take by mouth.    [provider]  naproxen (NAPROSYN) 500 MG tablet Take 1 tablet (500 mg total) by mouth 2 (two) times daily with a meal. 11/19/15   Sharman Cheek, MD  norgestimate-ethinyl estradiol (SPRINTEC 28) 0.25-35 MG-MCG tablet Take by mouth. 12/26/14   [provider]    Allergies Augmentin [amoxicillin-pot clavulanate] and Latex    Social History Social History   Tobacco Use  . Smoking status: Never Smoker  Substance Use Topics  . Alcohol use: No  . Drug use: No    Review of Systems Patient denies headaches, rhinorrhea, blurry vision, numbness, shortness of breath, chest pain, edema, cough, abdominal pain, nausea, vomiting, diarrhea, dysuria, fevers, rashes or hallucinations unless otherwise stated above in HPI. ____________________________________________   PHYSICAL EXAM:  VITAL SIGNS: Vitals:   08/16/18 2041  BP: (!) 147/93  Pulse: (!) 115  Resp: 18  Temp: 98.1 F (36.7 C)  SpO2: 100%    Constitutional: Alert and oriented.  Eyes: Conjunctivae are normal.  Head: Atraumatic. Nose: No congestion/rhinnorhea. Mouth/Throat: Mucous membranes are moist.   Neck: No stridor. Painless ROM.  Cardiovascular: Normal rate, regular rhythm. Grossly normal heart sounds.  Good peripheral circulation. Respiratory: Normal respiratory effort.  No retractions. Lungs CTAB. Gastrointestinal: Soft and nontender.  No distention. No abdominal bruits. No CVA tenderness. Genitourinary: deferred Musculoskeletal: No lower extremity tenderness nor edema.  No joint effusions. Neurologic:  Normal speech and language. No gross focal neurologic deficits are appreciated. No facial droop Skin:  Skin is warm, dry and intact. No rash noted. Psychiatric: Mood and affect are normal. Speech and behavior are normal.  ____________________________________________   LABS (all labs ordered are listed, but only abnormal results are displayed)  Results for orders placed or performed during the hospital  encounter of 08/16/18 (from the past 24 hour(s))  CBC with Differential     Status: None   Collection Time: 08/16/18  8:44 PM  Result Value Ref Range   WBC 7.1 4.0 - 10.5 K/uL   RBC 4.89 3.87 - 5.11 MIL/uL   Hemoglobin 14.7 12.0 - 15.0 g/dL   HCT 16.1 09.6 - 04.5 %   MCV 87.9 80.0 - 100.0 fL   MCH 30.1 26.0 - 34.0 pg   MCHC 34.2 30.0 - 36.0 g/dL   RDW 40.9 81.1 - 91.4 %   Platelets 392 150 - 400 K/uL   nRBC 0.0 0.0 - 0.2 %   Neutrophils Relative % 53 %   Neutro Abs 3.7 1.7 - 7.7 K/uL   Lymphocytes Relative 37 %   Lymphs Abs 2.7 0.7 - 4.0 K/uL   Monocytes Relative 8 %   Monocytes Absolute 0.6 0.1 - 1.0 K/uL   Eosinophils Relative 2 %   Eosinophils Absolute 0.1 0.0 - 0.5 K/uL   Basophils Relative 0 %   Basophils Absolute 0.0 0.0 - 0.1 K/uL   Immature Granulocytes 0 %   Abs Immature Granulocytes 0.02 0.00 - 0.07 K/uL  Urinalysis, Complete w Microscopic     Status: Abnormal   Collection Time: 08/16/18  8:44 PM  Result Value Ref Range   Color, Urine YELLOW (A) YELLOW   APPearance HAZY (A) CLEAR   Specific Gravity, Urine 1.026 1.005 - 1.030   pH 5.0 5.0 - 8.0   Glucose, UA NEGATIVE NEGATIVE mg/dL   Hgb urine dipstick LARGE (A) NEGATIVE   Bilirubin Urine NEGATIVE NEGATIVE   Ketones, ur NEGATIVE NEGATIVE mg/dL   Protein, ur 30 (A) NEGATIVE mg/dL   Nitrite NEGATIVE NEGATIVE   Leukocytes, UA NEGATIVE NEGATIVE   RBC / HPF 0-5 0 - 5 RBC/hpf   WBC, UA 0-5 0 - 5 WBC/hpf   Bacteria, UA RARE (A) NONE SEEN   Squamous Epithelial / LPF 0-5 0 - 5   Mucus PRESENT   Comprehensive metabolic panel     Status: Abnormal   Collection Time: 08/16/18  8:44 PM  Result Value Ref Range   Sodium 136 135 - 145 mmol/L   Potassium 3.3 (L) 3.5 - 5.1 mmol/L   Chloride 103 98 - 111 mmol/L   CO2 24 22 - 32 mmol/L   Glucose, Bld 123 (H) 70 - 99 mg/dL   BUN 8 6 - 20 mg/dL   Creatinine, Ser 7.82 0.44 - 1.00 mg/dL   Calcium 9.4 8.9 - 95.6 mg/dL   Total Protein 7.9 6.5 - 8.1 g/dL   Albumin 4.8 3.5 - 5.0  g/dL   AST 27 15 - 41 U/L   ALT 31 0 - 44 U/L   Alkaline Phosphatase 71 38 - 126 U/L   Total Bilirubin 0.7 0.3 - 1.2 mg/dL   GFR calc non Af Amer >60 >60 mL/min   GFR calc Af Amer >60 >60 mL/min   Anion gap 9 5 -  15  ABO/Rh     Status: None   Collection Time: 08/16/18  8:44 PM  Result Value Ref Range   ABO/RH(D)      O POS Performed at [facility], [address] [place], [place] [address]   Pregnancy, urine POC     Status: Abnormal   Collection Time: 08/16/18  8:59 PM  Result Value Ref Range   Preg Test, Ur POSITIVE (A) NEGATIVE   ____________________________________________ ____________________________________________  RADIOLOGY  I personally reviewed all radiographic images ordered to evaluate for the above acute complaints and reviewed radiology reports and findings.  These findings were personally discussed with the patient.  Please see medical record for radiology report.  ____________________________________________   PROCEDURES  Procedure(s) performed:  Procedures    Critical Care performed: no ____________________________________________   INITIAL IMPRESSION / ASSESSMENT AND PLAN / ED COURSE  Pertinent labs & imaging results that were available during my care of the patient were reviewed by me and considered in my medical decision making (see chart for details).   DDX: ectopic, aub, miscarriage, threatened ab  Victoria Becker is a 22 y.o. who presents to the ED with bleeding in early pregnancy.  Patient nontoxic-appearing and does not seem appropriately anxious.  Her abdominal exam is soft and benign.  Blood work sent for the above differential is reassuring.  She is Rh+.  Transvaginal ultrasound ordered to exclude ectopic and evaluate for possible miscarriage.  Appear to have IUP based on my review of images.  Formal read shows early IUP.  No evidence of ectopic or torsion.  Patient has follow-up with OB/GYN tomorrow.    Have discussed with the patient  and available family all diagnostics and treatments performed thus far and all questions were answered to the best of my ability. The patient demonstrates understanding and agreement with plan.       As part of my medical decision making, I reviewed the following data within the electronic MEDICAL RECORD NUMBER Nursing notes reviewed and incorporated, Labs reviewed, notes from prior ED visits and [place] Controlled Substance Database   ____________________________________________   FINAL CLINICAL IMPRESSION(S) / ED DIAGNOSES  Final diagnoses:  Vaginal bleeding  Right lower quadrant pain  Vaginal bleeding in pregnancy      NEW MEDICATIONS STARTED DURING THIS VISIT:  New Prescriptions   No medications on file     Note:  This document was prepared using Dragon voice recognition software and may include unintentional dictation errors.    Willy Eddy, MD 08/16/18 0981    Willy Eddy, MD 08/16/18 2152

## 2018-08-16 NOTE — ED Triage Notes (Signed)
Patient ambulatory to triage with steady gait, without difficulty or distress noted; pt reports vag bleeding with back pain this evening; pt is pregnant but unsure how far along; G1

## 2018-08-17 ENCOUNTER — Encounter: Payer: Self-pay | Admitting: Maternal Newborn

## 2018-08-17 ENCOUNTER — Other Ambulatory Visit (HOSPITAL_COMMUNITY)
Admission: RE | Admit: 2018-08-17 | Discharge: 2018-08-17 | Disposition: A | Discharge status: Home / Self Care | Payer: Managed Care, Other (non HMO) | Source: Ambulatory Visit | Attending: Maternal Newborn | Admitting: Maternal Newborn

## 2018-08-17 ENCOUNTER — Ambulatory Visit (INDEPENDENT_AMBULATORY_CARE_PROVIDER_SITE_OTHER): Payer: Managed Care, Other (non HMO) | Admitting: Maternal Newborn

## 2018-08-17 VITALS — BP 110/80 | Ht <= 58 in | Wt 159.0 lb

## 2018-08-17 DIAGNOSIS — O99280 Endocrine, nutritional and metabolic diseases complicating pregnancy, unspecified trimester: Secondary | ICD-10-CM

## 2018-08-17 DIAGNOSIS — O99281 Endocrine, nutritional and metabolic diseases complicating pregnancy, first trimester: Secondary | ICD-10-CM

## 2018-08-17 DIAGNOSIS — Z113 Encounter for screening for infections with a predominantly sexual mode of transmission: Secondary | ICD-10-CM | POA: Diagnosis not present

## 2018-08-17 DIAGNOSIS — E079 Disorder of thyroid, unspecified: Secondary | ICD-10-CM | POA: Diagnosis not present

## 2018-08-17 DIAGNOSIS — O0990 Supervision of high risk pregnancy, unspecified, unspecified trimester: Secondary | ICD-10-CM

## 2018-08-17 DIAGNOSIS — Z3A01 Less than 8 weeks gestation of pregnancy: Secondary | ICD-10-CM

## 2018-08-17 DIAGNOSIS — Z0189 Encounter for other specified special examinations: Secondary | ICD-10-CM

## 2018-08-17 NOTE — Progress Notes (Signed)
C/O in [facility] ED last night for bleeding; pain back and right side [id number]

## 2018-08-17 NOTE — Progress Notes (Signed)
08/17/2018   Chief Complaint: Amenorrhea, positive home pregnancy test, desires prenatal care.   Transfer of Care Patient: no  History of Present Illness: Victoria Becker is a 22 y.o. G1P0000 at [redacted]w[redacted]d based on Patient's last menstrual period on 07/01/2018 (approximate), with an Estimated Date of Delivery: 04/07/2019, with the above CC.   Her periods were: irregular periods, sometimes every few months, sometimes lasting for several months with PCOS.  She has Positive signs or symptoms of nausea/vomiting of pregnancy. She has possible signs or symptoms of miscarriage: bright red vaginal bleeding last night during intercourse, stopped about 2 hours later. Was seen in the ED afterwards and had an ultrasound and hCG that was 27,799 .  She identifies Negative Zika risk factors for her and her partner On any different medications around the time she conceived/early pregnancy: Yes , metformin and levothyroxine.  History of varicella: Yes    Review of Systems  Constitutional: Positive for weight loss.       Appetite loss  HENT: Positive for congestion and sore throat.   Eyes: Negative.   Respiratory: Positive for cough and shortness of breath. Negative for wheezing.        With allergies  Cardiovascular: Positive for chest pain. Negative for palpitations.       With cough  Gastrointestinal: Positive for constipation, nausea and vomiting.  Genitourinary: Negative.   Musculoskeletal: Positive for back pain and joint pain.  Skin: Negative.   Neurological: Negative.   Endo/Heme/Allergies: Positive for environmental allergies.  Psychiatric/Behavioral: Negative.   Breasts: tenderness  Review of systems was otherwise negative, except as stated in the above HPI.  OBGYN History: As per HPI. OB History  Gravida Para Term Preterm AB Living  1 0 0 0 0 0  SAB TAB Ectopic Multiple Live Births  0 0 0 0      # Outcome Date GA Lbr Len/2nd Weight Sex Delivery Anes PTL Lv  1 Current             Any  issues with any prior pregnancies: not applicable Any prior children are healthy, doing well, without any problems or issues: not applicable History of pap smears: Yes, in 2015, defers today for bleeding but will consent to Pap once viability established History of STIs: No   Past Medical History: Past Medical History:  Diagnosis Date  . Amenorrhea   . Chronic pelvic pain in female   . Dyspareunia   . Dysphagia   . Endometriosis   . Enlarged thyroid   . Follicular cyst of ovary   . Heart murmur   . Heavy periods   . Hypothyroidism    HASHIMOTO'S  . Kidney stone   . PCOS (polycystic ovarian syndrome)   . Pelvic pain in female   . Raynaud disease   . Syncope   . UTI (lower urinary tract infection)     Past Surgical History: Past Surgical History:  Procedure Laterality Date  . APPENDECTOMY  10/2013  . CHOLECYSTECTOMY    . COLON SURGERY  08/2013  . WISDOM TOOTH EXTRACTION  2012/2013   ONE    Family History:  Family History  Problem Relation Age of Onset  . Diabetes Mother   . Hypertension Mother   . Diabetes Sister        GESTATIONAL  . Hypertension Sister   . Diabetes Maternal Grandmother   . Hypertension Maternal Grandmother   . Stroke Maternal Grandmother   . Heart disease Maternal Grandmother   . Diabetes Paternal  Grandmother   . Stroke Paternal Grandmother   . Heart disease Paternal Grandmother   . Cancer Paternal Grandfather        BRAIN  . Cancer Paternal Aunt 58       BREAST    She denies any female cancers, bleeding or blood clotting disorders.  Patient reports presence of a heart murmur since birth in herself. She reports no other history of intellectual disability, birth defects or genetic disorders in her or the FOB's history  Social History:  Social History   Socioeconomic History  . Marital status: Single    Spouse name: Not on file  . Number of children: 0  . Years of education: 26  . Highest education level: Not on file  Occupational  History  . Occupation: CNA  . Occupation: STUDENT    Comment: [employer]  Social Needs  . Financial resource strain: Not on file  . Food insecurity:    Worry: Not on file    Inability: Not on file  . Transportation needs:    Medical: Not on file    Non-medical: Not on file  Tobacco Use  . Smoking status: Never Smoker  . Smokeless tobacco: Never Used  Substance and Sexual Activity  . Alcohol use: No  . Drug use: No  . Sexual activity: Yes    Birth control/protection: None  Lifestyle  . Physical activity:    Days per week: Not on file    Minutes per session: Not on file  . Stress: Not on file  Relationships  . Social connections:    Talks on phone: Not on file    Gets together: Not on file    Attends religious service: Not on file    Active member of club or organization: Not on file    Attends meetings of clubs or organizations: Not on file    Relationship status: Not on file  . Intimate partner violence:    Fear of current or ex partner: Not on file    Emotionally abused: Not on file    Physically abused: Not on file    Forced sexual activity: Not on file  Other Topics Concern  . Not on file  Social History Narrative  . Not on file   Any cats in the household: yes, and she is aware to avoid cat feces and litter. Domestic violence screen negative.  Allergy: Allergies  Allergen Reactions  . Amoxicillin-Pot Clavulanate Other (See Comments) and Nausea And Vomiting    Projectile Vomiting vomitting Projectile Vomiting   . Hydromorphone Other (See Comments)    Migraine headache Migraine headache   . Latex     Current Outpatient Medications:  Current Outpatient Medications:  .  Biotin 10 MG CAPS, Take by mouth., Disp: , Rfl:  .  levothyroxine (SYNTHROID, LEVOTHROID) 125 MCG tablet, Take 1 tablet by mouth daily., Disp: , Rfl:  .  metFORMIN (GLUCOPHAGE-XR) 500 MG 24 hr tablet, Take 1 tablet by mouth daily., Disp: , Rfl:  .  Prenatal Vit-Fe Fumarate-FA  (MULTIVITAMIN-PRENATAL) 27-0.8 MG TABS tablet, Take 1 tablet by mouth daily at 12 noon., Disp: , Rfl:    Physical Exam:   BP 110/80   Ht 4' 9.75" (1.467 m)   Wt 159 lb (72.1 kg)   LMP 07/01/2018 (Approximate)   BMI 33.52 kg/m  Body mass index is 33.52 kg/m. Constitutional: Well nourished, well developed female in no acute distress.  Neck:  Supple, normal appearance, and no thyromegaly  Cardiovascular: S1,  S2 normal, no murmur, rub or gallop, regular rate and rhythm Respiratory:  Clear to auscultation bilateral. Normal respiratory effort Abdomen: no masses, hernias; diffusely non tender to palpation, non distended Breasts: breasts appear normal, no suspicious masses, no skin or nipple changes or axillary nodes. Neuro/Psych:  Normal mood and affect.  Skin:  Warm and dry.  Lymphatic:  No inguinal lymphadenopathy.   Pelvic exam: Deferred today, to be done with Pap once viability established.  Assessment: Victoria Becker is a 22 y.o. G1P0000 [redacted]w[redacted]d based on Patient's last menstrual period on 07/01/2018 (approximate), with an Estimated Date of Delivery: 04/07/2019, presenting for prenatal care.  Plan:  1) Avoid alcoholic beverages. 2) Patient encouraged not to smoke.  3) Discontinue the use of all non-medicinal drugs and chemicals.  4) Take prenatal vitamins daily.  5) Seatbelt use advised 6) Nutrition, food safety (fish, cheese advisories, and high nitrite foods) and exercise discussed. 7) Hospital and practice style delivering at [facility] discussed  8) Patient is asked about travel to areas at risk for the Zika virus, and counseled to avoid travel and exposure to mosquitoes or sexual partners who may have themselves been exposed to the virus. Testing is discussed, and will be ordered as appropriate.  9) Childbirth classes at [facility] advised 10) Genetic Screening, such as with 1st Trimester Screening, cell free fetal DNA, AFP testing, and Ultrasound, as well as with amniocentesis and CVS as  appropriate, is discussed with patient. She is considering genetic testing this pregnancy. 11) Uncertain viability following ED visit on 10/15. Ultrasound showed gestational sac but no fetal pole. Cessation of bleeding is reassuring and LMP dating may be incorrect due to irregular cycle. 12) Beta hCG level on Friday 10/18 and follow up ultrasound in 2 weeks for viability/dating. 13) Will need 1 hour GTT and Pap after viability confirmed. 14) Bonjesta sample given for nausea.  Problem list reviewed and updated.  Return in about 2 weeks (around 08/31/2018) for ROB with dating/viability.  Marcelyn Bruins, CNM [facility], [facility] 08/17/2018  3:44 PM

## 2018-08-18 LAB — RH TYPE: Rh Factor: POSITIVE

## 2018-08-18 LAB — HIV ANTIBODY (ROUTINE TESTING W REFLEX): HIV Screen 4th Generation wRfx: NONREACTIVE

## 2018-08-18 LAB — VARICELLA ZOSTER ANTIBODY, IGG: VARICELLA: 1136 {index} (ref >165)

## 2018-08-18 LAB — RPR: RPR: NONREACTIVE

## 2018-08-18 LAB — ABO

## 2018-08-18 LAB — RUBELLA SCREEN: Rubella Antibodies, IGG: 2.14 index (ref >0.99)

## 2018-08-18 LAB — HEPATITIS B SURFACE ANTIGEN: Hepatitis B Surface Ag: NEGATIVE

## 2018-08-18 LAB — ANTIBODY SCREEN: Antibody Screen: NEGATIVE

## 2018-08-19 LAB — URINE DRUG PANEL 7
Amphetamines, Urine: NEGATIVE ng/mL
Barbiturate Quant, Ur: NEGATIVE ng/mL
Benzodiazepine Quant, Ur: NEGATIVE ng/mL
COCAINE (METAB.): NEGATIVE ng/mL
Cannabinoid Quant, Ur: NEGATIVE ng/mL
OPIATE QUANT UR: NEGATIVE ng/mL
PCP Quant, Ur: NEGATIVE ng/mL

## 2018-08-19 LAB — URINE CULTURE

## 2018-08-19 LAB — URINE CYTOLOGY ANCILLARY ONLY
CHLAMYDIA, DNA PROBE: NEGATIVE
Neisseria Gonorrhea: NEGATIVE

## 2018-08-20 ENCOUNTER — Other Ambulatory Visit: Payer: Self-pay | Admitting: Maternal Newborn

## 2018-08-20 ENCOUNTER — Telehealth: Payer: Self-pay | Admitting: Maternal Newborn

## 2018-08-20 ENCOUNTER — Other Ambulatory Visit: Payer: Managed Care, Other (non HMO)

## 2018-08-20 DIAGNOSIS — O219 Vomiting of pregnancy, unspecified: Secondary | ICD-10-CM

## 2018-08-20 DIAGNOSIS — O0990 Supervision of high risk pregnancy, unspecified, unspecified trimester: Secondary | ICD-10-CM

## 2018-08-20 MED ORDER — DOXYLAMINE-PYRIDOXINE ER 20-20 MG PO TBCR: 1.0000 | EXTENDED_RELEASE_TABLET | Freq: Every day | ORAL | 2 refills | Status: DC

## 2018-08-20 NOTE — Telephone Encounter (Signed)
Patient was given sample of Bonjesta and said this was working for her and would like Rx sent to her pharmacy, [facility] [address].

## 2018-08-20 NOTE — Progress Notes (Signed)
Rx for [employer].

## 2018-08-21 LAB — BETA HCG QUANT (REF LAB): hCG Quant: 39311 m[IU]/mL

## 2018-08-31 ENCOUNTER — Ambulatory Visit (INDEPENDENT_AMBULATORY_CARE_PROVIDER_SITE_OTHER): Payer: Managed Care, Other (non HMO) | Admitting: Maternal Newborn

## 2018-08-31 ENCOUNTER — Encounter: Payer: Self-pay | Admitting: Maternal Newborn

## 2018-08-31 ENCOUNTER — Ambulatory Visit (INDEPENDENT_AMBULATORY_CARE_PROVIDER_SITE_OTHER): Payer: Managed Care, Other (non HMO)

## 2018-08-31 VITALS — BP 120/70 | Wt 160.0 lb

## 2018-08-31 DIAGNOSIS — Z3A01 Less than 8 weeks gestation of pregnancy: Secondary | ICD-10-CM | POA: Diagnosis not present

## 2018-08-31 DIAGNOSIS — O3411 Maternal care for benign tumor of corpus uteri, first trimester: Secondary | ICD-10-CM

## 2018-08-31 DIAGNOSIS — O0990 Supervision of high risk pregnancy, unspecified, unspecified trimester: Secondary | ICD-10-CM

## 2018-08-31 DIAGNOSIS — Z0189 Encounter for other specified special examinations: Secondary | ICD-10-CM

## 2018-08-31 DIAGNOSIS — N8312 Corpus luteum cyst of left ovary: Secondary | ICD-10-CM | POA: Diagnosis not present

## 2018-08-31 DIAGNOSIS — Z6834 Body mass index (BMI) 34.0-34.9, adult: Secondary | ICD-10-CM

## 2018-08-31 DIAGNOSIS — O219 Vomiting of pregnancy, unspecified: Secondary | ICD-10-CM

## 2018-08-31 LAB — POCT URINALYSIS DIPSTICK OB
GLUCOSE, UA: NEGATIVE
POC,PROTEIN,UA: NEGATIVE

## 2018-08-31 MED ORDER — METOCLOPRAMIDE HCL 10 MG PO TABS: 10.0000 mg | ORAL_TABLET | Freq: Four times a day (QID) | ORAL | 2 refills | Status: DC | PRN

## 2018-08-31 NOTE — Progress Notes (Signed)
    Routine Prenatal Care Visit  Subjective  Victoria Becker is a 22 y.o. G1P0000 at [redacted]w[redacted]d being seen today for ongoing prenatal care.  She is currently monitored for the following issues for this high-risk pregnancy and has Supervision of high risk pregnancy, antepartum on their problem list.  ----------------------------------------------------------------------------------- Patient reports nausea and vomiting. She is having a difficult time keeping down anything but cereal and Pedialyte.   Vag. Bleeding: None.  No leaking of fluid.  ----------------------------------------------------------------------------------- The following portions of the patient's history were reviewed and updated as appropriate: allergies, current medications, past family history, past medical history, past social history, past surgical history and problem list. Problem list updated.   Objective  Blood pressure 120/70, weight 160 lb (72.6 kg), last menstrual period 07/01/2018. Pregravid weight 164 lb (74.4 kg) Total Weight Gain -4 lb (-1.814 kg) Body mass index is 33.73 kg/m.   Urinalysis: Protein Negative, Glucose Negative Fetal Status: Fetal Heart Rate (bpm): 166         General:  Alert, oriented and cooperative. Patient is in no acute distress.  Skin: Skin is warm and dry. No rash noted.   Cardiovascular: Normal heart rate noted  Respiratory: Normal respiratory effort, no problems with respiration noted  Abdomen: Soft, gravid, appropriate for gestational age.       Pelvic:  Cervical exam deferred        Extremities: Normal range of motion.     Mental Status: Normal mood and affect. Normal behavior. Normal judgment and thought content.     Assessment   22 y.o. G1P0000 at [redacted]w[redacted]d, EDD 04/13/2019 by Ultrasound presenting for a routine prenatal visit.  Plan   FIRST Problems (from 08/17/18 to present)    Problem Noted Resolved   Supervision of high risk pregnancy, antepartum 08/17/2018 by Oswaldo Conroy,  CNM No   Overview Signed 08/31/2018  3:27 PM by Oswaldo Conroy, CNM    Clinic [facility] Prenatal Labs  Dating  Blood type: O/Positive/-- (10/15 1622)   Genetic Screen 1 Screen:    AFP:     Quad:     NIPS: Antibody:Negative (10/15 1622)  Anatomic [place]  Rubella: 2.14 (10/15 1622) Varicella:    GTT Early:               Third trimester:  RPR: Non Reactive (10/15 1622)   Rhogam  HBsAg: Negative (10/15 1622)   TDaP vaccine                       Flu Shot: HIV: Non Reactive (10/15 1622)   Baby Food                                GBS:   Contraception  Pap:  CBB     CS/VBAC    Support Person                 Ultrasound today shows singleton IUP at 7w 6d gestation with FHR of 166. Dating changed based on ACOG Guidelines and EDD is now 04/13/2019.  Discussed measures to control nausea; changing medication to Reglan. She is aware to monitor for signs of dehydration and go to the hospital for IV hydration if she cannot tolerate food/drink without vomiting.  Return in about 4 weeks (around 09/28/2018) for ROB.  Marcelyn Bruins, CNM 08/31/2018

## 2018-08-31 NOTE — Progress Notes (Signed)
C/o really sick all weekend; results of labs.rj

## 2018-09-01 ENCOUNTER — Other Ambulatory Visit: Payer: Self-pay

## 2018-09-01 ENCOUNTER — Encounter: Payer: Self-pay | Admitting: Emergency Medicine

## 2018-09-01 DIAGNOSIS — O219 Vomiting of pregnancy, unspecified: Secondary | ICD-10-CM | POA: Insufficient documentation

## 2018-09-01 DIAGNOSIS — Z79899 Other long term (current) drug therapy: Secondary | ICD-10-CM | POA: Diagnosis not present

## 2018-09-01 DIAGNOSIS — O210 Mild hyperemesis gravidarum: Secondary | ICD-10-CM | POA: Insufficient documentation

## 2018-09-01 DIAGNOSIS — E039 Hypothyroidism, unspecified: Secondary | ICD-10-CM | POA: Insufficient documentation

## 2018-09-01 DIAGNOSIS — O99611 Diseases of the digestive system complicating pregnancy, first trimester: Secondary | ICD-10-CM | POA: Insufficient documentation

## 2018-09-01 DIAGNOSIS — Z3A08 8 weeks gestation of pregnancy: Secondary | ICD-10-CM | POA: Diagnosis not present

## 2018-09-01 DIAGNOSIS — Z7984 Long term (current) use of oral hypoglycemic drugs: Secondary | ICD-10-CM | POA: Insufficient documentation

## 2018-09-01 DIAGNOSIS — O26851 Spotting complicating pregnancy, first trimester: Secondary | ICD-10-CM | POA: Diagnosis not present

## 2018-09-01 DIAGNOSIS — Z91040 Latex allergy status: Secondary | ICD-10-CM | POA: Diagnosis not present

## 2018-09-01 LAB — CBC
HEMATOCRIT: 42.6 % (ref 36.0–46.0)
Hemoglobin: 14.8 g/dL (ref 12.0–15.0)
MCH: 30.8 pg (ref 26.0–34.0)
MCHC: 34.7 g/dL (ref 30.0–36.0)
MCV: 88.6 fL (ref 80.0–100.0)
Platelets: 361 10*3/uL (ref 150–400)
RBC: 4.81 MIL/uL (ref 3.87–5.11)
RDW: 11.8 % (ref 11.5–15.5)
WBC: 11.1 10*3/uL — AB (ref 4.0–10.5)
nRBC: 0 % (ref 0.0–0.2)

## 2018-09-01 LAB — COMPREHENSIVE METABOLIC PANEL
ALBUMIN: 4.5 g/dL (ref 3.5–5.0)
ALT: 38 U/L (ref 0–44)
ANION GAP: 10 (ref 5–15)
AST: 28 U/L (ref 15–41)
Alkaline Phosphatase: 55 U/L (ref 38–126)
BILIRUBIN TOTAL: 0.8 mg/dL (ref 0.3–1.2)
BUN: 6 mg/dL (ref 6–20)
CHLORIDE: 104 mmol/L (ref 98–111)
CO2: 24 mmol/L (ref 22–32)
Calcium: 9.6 mg/dL (ref 8.9–10.3)
Creatinine, Ser: 0.64 mg/dL (ref 0.44–1.00)
GFR calc Af Amer: >60 mL/min (ref >60)
GFR calc non Af Amer: >60 mL/min (ref >60)
GLUCOSE: 96 mg/dL (ref 70–99)
POTASSIUM: 3.7 mmol/L (ref 3.5–5.1)
Sodium: 138 mmol/L (ref 135–145)
TOTAL PROTEIN: 7.7 g/dL (ref 6.5–8.1)

## 2018-09-01 LAB — LIPASE, BLOOD: LIPASE: 32 U/L (ref 11–51)

## 2018-09-01 NOTE — ED Triage Notes (Signed)
Pt here for NVD X 5 days. Unable to keep anything down. [redacted] weeks pregnant and tried reglan and bonjesta at home without relief. No fevers.  Ambulatory. VSS.  No abdominal pain, just sore across shoulders per pt.  No vomiting in triage.

## 2018-09-02 ENCOUNTER — Emergency Department: Payer: Managed Care, Other (non HMO)

## 2018-09-02 ENCOUNTER — Emergency Department
Admission: EM | Admit: 2018-09-02 | Discharge: 2018-09-02 | Disposition: A | Discharge status: Home / Self Care | Payer: Managed Care, Other (non HMO) | Attending: Emergency Medicine | Admitting: Emergency Medicine

## 2018-09-02 DIAGNOSIS — O210 Mild hyperemesis gravidarum: Secondary | ICD-10-CM

## 2018-09-02 DIAGNOSIS — K529 Noninfective gastroenteritis and colitis, unspecified: Secondary | ICD-10-CM

## 2018-09-02 LAB — URINALYSIS, COMPLETE (UACMP) WITH MICROSCOPIC
Bilirubin Urine: NEGATIVE
Glucose, UA: NEGATIVE mg/dL
Hgb urine dipstick: NEGATIVE
Ketones, ur: 80 mg/dL — AB
Leukocytes, UA: NEGATIVE
NITRITE: NEGATIVE
Protein, ur: 100 mg/dL — AB
Specific Gravity, Urine: 1.031 — ABNORMAL HIGH (ref 1.005–1.030)
pH: 5 (ref 5.0–8.0)

## 2018-09-02 LAB — PREGNANCY, URINE: PREG TEST UR: POSITIVE — AB

## 2018-09-02 MED ORDER — ONDANSETRON HCL 4 MG/2ML IJ SOLN
4.0000 mg | Freq: Once | INTRAMUSCULAR | Status: AC
  Administered 2018-09-02: 4 mg via INTRAVENOUS
  Filled 2018-09-02: qty 2

## 2018-09-02 MED ORDER — DEXTROSE-NACL 5-0.9 % IV SOLN
INTRAVENOUS | Status: DC
  Administered 2018-09-02: 02:00:00 via INTRAVENOUS

## 2018-09-02 MED ORDER — ONDANSETRON HCL 4 MG/2ML IJ SOLN: 4.0000 mg | Freq: Once | INTRAMUSCULAR | Status: DC

## 2018-09-02 MED ORDER — PROMETHAZINE HCL 25 MG/ML IJ SOLN: 12.5000 mg | Freq: Once | INTRAMUSCULAR | Status: DC

## 2018-09-02 MED ORDER — PROMETHAZINE HCL 25 MG PO TABS: 25.0000 mg | ORAL_TABLET | Freq: Four times a day (QID) | ORAL | 0 refills | Status: DC | PRN

## 2018-09-02 MED ORDER — METOCLOPRAMIDE HCL 5 MG/ML IJ SOLN
10.0000 mg | Freq: Once | INTRAMUSCULAR | Status: AC
  Administered 2018-09-02: 10 mg via INTRAVENOUS
  Filled 2018-09-02: qty 2

## 2018-09-02 MED ORDER — SODIUM CHLORIDE 0.9 % IV BOLUS
1000.0000 mL | Freq: Once | INTRAVENOUS | Status: AC
  Administered 2018-09-02: 1000 mL via INTRAVENOUS

## 2018-09-02 MED ORDER — PROMETHAZINE HCL 25 MG/ML IJ SOLN
12.5000 mg | Freq: Once | INTRAMUSCULAR | Status: AC
  Administered 2018-09-02: 12.5 mg via INTRAVENOUS
  Filled 2018-09-02: qty 1

## 2018-09-02 NOTE — ED Notes (Signed)
Pt given crackers and water to try to see if pt can keep down per MD.

## 2018-09-02 NOTE — ED Notes (Signed)
Pt able to eat multiple crackers as well as drink some water. MD notified.

## 2018-09-02 NOTE — ED Notes (Signed)
Patient transported to Ultrasound 

## 2018-09-02 NOTE — ED Provider Notes (Signed)
[facility] Emergency Department Provider Note    First MD Initiated Contact with Patient 09/02/18 0015     (approximate)  I have reviewed the triage vital signs and the nursing notes.   HISTORY  Chief Complaint Emesis    HPI Victoria Becker is a 22 y.o. female G1, P0 approximately [redacted] weeks pregnant with below list of medical conditions presents to the emergency department with nausea vomiting diarrhea x5 days with very poor p.o. intake.  Patient also admits to acute onset of vaginal spotting today.  Patient denies any abdominal pelvic pain.   Past Medical History:  Diagnosis Date  . Amenorrhea   . Chronic pelvic pain in female   . Dyspareunia   . Dysphagia   . Endometriosis   . Enlarged thyroid   . Follicular cyst of ovary   . Heart murmur   . Heavy periods   . Hypothyroidism    HASHIMOTO'S  . Kidney stone   . PCOS (polycystic ovarian syndrome)   . Pelvic pain in female   . Raynaud disease   . Syncope   . UTI (lower urinary tract infection)     Patient Active Problem List   Diagnosis Date Noted  . Nausea and vomiting during pregnancy 09/01/2018  . Supervision of high risk pregnancy, antepartum 08/17/2018    Past Surgical History:  Procedure Laterality Date  . APPENDECTOMY  10/2013  . CHOLECYSTECTOMY    . COLON SURGERY  08/2013  . WISDOM TOOTH EXTRACTION  2012/2013   ONE    Prior to Admission medications   Medication Sig Start Date End Date Taking? Authorizing Provider  Biotin 10 MG CAPS Take by mouth.   Yes [provider]  Doxylamine-Pyridoxine ER (BONJESTA) 20-20 MG TBCR Take 1 tablet by mouth at bedtime. 08/20/18  Yes Oswaldo Conroy, CNM  levothyroxine (SYNTHROID, LEVOTHROID) 125 MCG tablet Take 1 tablet by mouth daily. 07/10/18  Yes [provider]  metFORMIN (GLUCOPHAGE-XR) 500 MG 24 hr tablet Take 1 tablet by mouth daily. 04/05/18 04/05/19 Yes [provider]  metoCLOPramide (REGLAN) 10 MG tablet Take 1  tablet (10 mg total) by mouth 4 (four) times daily as needed for nausea or vomiting. 08/31/18  Yes Oswaldo Conroy, CNM  Prenatal Vit-Fe Fumarate-FA (MULTIVITAMIN-PRENATAL) 27-0.8 MG TABS tablet Take 1 tablet by mouth daily at 12 noon.   Yes [provider]    Allergies Amoxicillin-pot clavulanate; Hydromorphone; and Latex  Family History  Problem Relation Age of Onset  . Diabetes Mother   . Hypertension Mother   . Diabetes Sister        GESTATIONAL  . Hypertension Sister   . Diabetes Maternal Grandmother   . Hypertension Maternal Grandmother   . Stroke Maternal Grandmother   . Heart disease Maternal Grandmother   . Diabetes Paternal Grandmother   . Stroke Paternal Grandmother   . Heart disease Paternal Grandmother   . Cancer Paternal Grandfather        BRAIN  . Cancer Paternal Aunt 50       BREAST    Social History Social History   Tobacco Use  . Smoking status: Never Smoker  . Smokeless tobacco: Never Used  Substance Use Topics  . Alcohol use: No  . Drug use: No    Review of Systems Constitutional: No fever/chills Eyes: No visual changes. ENT: No sore throat. Cardiovascular: Denies chest pain. Respiratory: Denies shortness of breath. Gastrointestinal: No abdominal pain.  No nausea, no vomiting.  No  diarrhea.  No constipation. Genitourinary: Negative for dysuria. Musculoskeletal: Negative for neck pain.  Negative for back pain. Integumentary: Negative for rash. Neurological: Negative for headaches, focal weakness or numbness.   ____________________________________________   PHYSICAL EXAM:  VITAL SIGNS: ED Triage Vitals  Enc Vitals Group     BP 09/01/18 2233 (!) 119/94     Pulse Rate 09/01/18 2233 (!) 105     Resp 09/01/18 2233 18     Temp 09/01/18 2233 98.3 F (36.8 C)     Temp Source 09/01/18 2233 Oral     SpO2 09/01/18 2233 100 %     Weight 09/01/18 2230 71.2 kg (157 lb)     Height 09/01/18 2230 1.448 m (4\' 9" )     Head Circumference  --      Peak Flow --      Pain Score 09/01/18 2230 3     Pain Loc --      Pain Edu? --      Excl. in GC? --     Constitutional: Alert and oriented. Well appearing and in no acute distress. Eyes: Conjunctivae are normal.  Mouth/Throat: Mucous membranes are moist.  Oropharynx non-erythematous. Neck: No stridor.  Cardiovascular: Normal rate, regular rhythm. Good peripheral circulation. Grossly normal heart sounds. Respiratory: Normal respiratory effort.  No retractions. Lungs CTAB. Gastrointestinal: Soft and nontender. No distention.  Musculoskeletal: No lower extremity tenderness nor edema. No gross deformities of extremities. Neurologic:  Normal speech and language. No gross focal neurologic deficits are appreciated.  Skin:  Skin is warm, dry and intact. No rash noted. Psychiatric: Mood and affect are normal. Speech and behavior are normal. ____________________________________________   LABS (all labs ordered are listed, but only abnormal results are displayed)  Labs Reviewed  CBC - Abnormal; Notable for the following components:      Result Value   WBC 11.1 (*)    All other components within normal limits  URINALYSIS, COMPLETE (UACMP) WITH MICROSCOPIC - Abnormal; Notable for the following components:   Color, Urine AMBER (*)    APPearance HAZY (*)    Specific Gravity, Urine 1.031 (*)    Ketones, ur 80 (*)    Protein, ur 100 (*)    Bacteria, UA RARE (*)    All other components within normal limits  PREGNANCY, URINE - Abnormal; Notable for the following components:   Preg Test, Ur POSITIVE (*)    All other components within normal limits  GASTROINTESTINAL PANEL BY PCR, STOOL (REPLACES STOOL CULTURE)  C DIFFICILE QUICK SCREEN W PCR REFLEX  LIPASE, BLOOD  COMPREHENSIVE METABOLIC PANEL  ABO/RH     RADIOLOGY I, Casey N Corrie Reder, personally viewed and evaluated these images (plain radiographs) as part of my medical decision making, as well as reviewing the written report  by the radiologist.  ED MD interpretation: Ultrasound revealed 8-week 1 day single live intrauterine pregnancy radiologist  Official radiology report(s): US Ob Comp Less 14 Wks  Result Date: 09/02/2018 CLINICAL DATA:  Initial evaluation for acute vaginal bleeding, early pregnancy. EXAM: OBSTETRIC <14 WK [place] AND TRANSVAGINAL OB US TECHNIQUE: Both transabdominal and transvaginal ultrasound examinations were performed for complete evaluation of the gestation as well as the maternal uterus, adnexal regions, and pelvic cul-de-sac. Transvaginal technique was performed to assess early pregnancy. COMPARISON:  None. FINDINGS: Intrauterine gestational sac: Single Yolk sac:  Present Embryo:  Present Cardiac Activity: Present Heart Rate: 173 bpm CRL: 16.9 mm   8 w   1 d  [place] EDC: 04/13/2019 Subchorionic hemorrhage:  None visualized. Maternal uterus/adnexae: Right ovary unremarkable and normal in appearance. Small corpus luteal cyst measuring up to 1.8 cm noted within the left ovary. Left ovary otherwise unremarkable. No adnexal mass. Trace free physiologic fluid noted within the pelvis. IMPRESSION: 1. Single viable intrauterine pregnancy as above without complication, estimated gestational age [redacted] weeks and 1 day by crown-rump length, with ultrasound EDC of 04/13/2019. 2. No other acute maternal uterine or adnexal abnormality identified. Electronically Signed   By: Rise Mu M.D.   On: 09/02/2018 03:31   US Ob Transvaginal  Result Date: 09/02/2018 CLINICAL DATA:  Initial evaluation for acute vaginal bleeding, early pregnancy. EXAM: OBSTETRIC <14 WK [place] AND TRANSVAGINAL OB US TECHNIQUE: Both transabdominal and transvaginal ultrasound examinations were performed for complete evaluation of the gestation as well as the maternal uterus, adnexal regions, and pelvic cul-de-sac. Transvaginal technique was performed to assess early pregnancy. COMPARISON:  None. FINDINGS: Intrauterine gestational sac:  Single Yolk sac:  Present Embryo:  Present Cardiac Activity: Present Heart Rate: 173 bpm CRL: 16.9 mm   8 w   1 d                  [place] EDC: 04/13/2019 Subchorionic hemorrhage:  None visualized. Maternal uterus/adnexae: Right ovary unremarkable and normal in appearance. Small corpus luteal cyst measuring up to 1.8 cm noted within the left ovary. Left ovary otherwise unremarkable. No adnexal mass. Trace free physiologic fluid noted within the pelvis. IMPRESSION: 1. Single viable intrauterine pregnancy as above without complication, estimated gestational age [redacted] weeks and 1 day by crown-rump length, with ultrasound EDC of 04/13/2019. 2. No other acute maternal uterine or adnexal abnormality identified. Electronically Signed   By: Rise Mu M.D.   On: 09/02/2018 03:31     Procedures   ____________________________________________   INITIAL IMPRESSION / ASSESSMENT AND PLAN / ED COURSE  As part of my medical decision making, I reviewed the following data within the electronic MEDICAL RECORD NUMBER   22 year old female presenting with history and physical exam consistent with possible gastroenteritis given diarrhea in addition to nausea and vomiting.  Symptoms also consistent with hyperemesis gravidarum as nausea and vomiting preceded the diarrhea.  Patient received 2 L IV normal saline as well as 1 L of D5 normal saline.  Patient given Phenergan 12.5 mg IV with continued nausea and vomiting and as such Reglan 10 mg IV was given however nausea persisted and at this point patient was given Zofran 4 mg IV with resolution of vomiting.  Regarding patient's vaginal spotting patient's blood type is O+ ultrasound revealed no acute abnormality. ____________________________________________  FINAL CLINICAL IMPRESSION(S) / ED DIAGNOSES  Final diagnoses:  Hyperemesis gravidarum  Gastroenteritis     MEDICATIONS GIVEN DURING THIS VISIT:  Medications  dextrose 5 %-0.9 % sodium chloride infusion ( Intravenous  New Bag/Given 09/02/18 0138)  promethazine (PHENERGAN) injection 12.5 mg (12.5 mg Intravenous Given 09/02/18 0145)  sodium chloride 0.9 % bolus 1,000 mL (0 mLs Intravenous Stopped 09/02/18 0216)  metoCLOPramide (REGLAN) injection 10 mg (10 mg Intravenous Given 09/02/18 0417)  sodium chloride 0.9 % bolus 1,000 mL (0 mLs Intravenous Stopped 09/02/18 0520)  ondansetron (ZOFRAN) injection 4 mg (4 mg Intravenous Given 09/02/18 0437)     ED Discharge Orders    None       Note:  This document was prepared using Dragon voice recognition software and may include unintentional dictation errors.    Darci Current, MD 09/02/18 [phone]

## 2018-09-05 ENCOUNTER — Emergency Department
Admission: EM | Admit: 2018-09-05 | Discharge: 2018-09-05 | Disposition: A | Discharge status: Home / Self Care | Payer: Managed Care, Other (non HMO) | Attending: Emergency Medicine | Admitting: Emergency Medicine

## 2018-09-05 ENCOUNTER — Encounter: Payer: Self-pay | Admitting: Emergency Medicine

## 2018-09-05 DIAGNOSIS — Z79899 Other long term (current) drug therapy: Secondary | ICD-10-CM | POA: Insufficient documentation

## 2018-09-05 DIAGNOSIS — E039 Hypothyroidism, unspecified: Secondary | ICD-10-CM | POA: Diagnosis not present

## 2018-09-05 DIAGNOSIS — O210 Mild hyperemesis gravidarum: Secondary | ICD-10-CM | POA: Diagnosis not present

## 2018-09-05 DIAGNOSIS — Z91040 Latex allergy status: Secondary | ICD-10-CM | POA: Insufficient documentation

## 2018-09-05 DIAGNOSIS — Z7984 Long term (current) use of oral hypoglycemic drugs: Secondary | ICD-10-CM | POA: Diagnosis not present

## 2018-09-05 DIAGNOSIS — O2311 Infections of bladder in pregnancy, first trimester: Secondary | ICD-10-CM | POA: Diagnosis not present

## 2018-09-05 DIAGNOSIS — N3000 Acute cystitis without hematuria: Secondary | ICD-10-CM

## 2018-09-05 DIAGNOSIS — O219 Vomiting of pregnancy, unspecified: Secondary | ICD-10-CM | POA: Diagnosis present

## 2018-09-05 HISTORY — DX: Irritable bowel syndrome, unspecified: K58.9

## 2018-09-05 LAB — URINALYSIS, COMPLETE (UACMP) WITH MICROSCOPIC
Bilirubin Urine: NEGATIVE
Glucose, UA: NEGATIVE mg/dL
Hgb urine dipstick: NEGATIVE
Ketones, ur: 80 mg/dL — AB
Leukocytes, UA: NEGATIVE
Nitrite: NEGATIVE
Protein, ur: 30 mg/dL — AB
SPECIFIC GRAVITY, URINE: 1.031 — AB (ref 1.005–1.030)
pH: 5 (ref 5.0–8.0)

## 2018-09-05 LAB — CBC
HCT: 43.6 % (ref 36.0–46.0)
HEMOGLOBIN: 14.7 g/dL (ref 12.0–15.0)
MCH: 30.1 pg (ref 26.0–34.0)
MCHC: 33.7 g/dL (ref 30.0–36.0)
MCV: 89.2 fL (ref 80.0–100.0)
Platelets: 361 10*3/uL (ref 150–400)
RBC: 4.89 MIL/uL (ref 3.87–5.11)
RDW: 11.7 % (ref 11.5–15.5)
WBC: 11 10*3/uL — AB (ref 4.0–10.5)
nRBC: 0 % (ref 0.0–0.2)

## 2018-09-05 LAB — LIPASE, BLOOD: Lipase: 37 U/L (ref 11–51)

## 2018-09-05 LAB — COMPREHENSIVE METABOLIC PANEL
ALBUMIN: 4.5 g/dL (ref 3.5–5.0)
ALT: 74 U/L — ABNORMAL HIGH (ref 0–44)
ANION GAP: 12 (ref 5–15)
AST: 40 U/L (ref 15–41)
Alkaline Phosphatase: 56 U/L (ref 38–126)
BILIRUBIN TOTAL: 1.2 mg/dL (ref 0.3–1.2)
BUN: 8 mg/dL (ref 6–20)
CO2: 21 mmol/L — ABNORMAL LOW (ref 22–32)
Calcium: 9.9 mg/dL (ref 8.9–10.3)
Chloride: 104 mmol/L (ref 98–111)
Creatinine, Ser: 0.67 mg/dL (ref 0.44–1.00)
GFR calc Af Amer: >60 mL/min (ref >60)
GLUCOSE: 79 mg/dL (ref 70–99)
POTASSIUM: 4.3 mmol/L (ref 3.5–5.1)
Sodium: 137 mmol/L (ref 135–145)
Total Protein: 8.2 g/dL — ABNORMAL HIGH (ref 6.5–8.1)

## 2018-09-05 LAB — HCG, QUANTITATIVE, PREGNANCY: HCG, BETA CHAIN, QUANT, S: 250100 m[IU]/mL — AB (ref <5)

## 2018-09-05 MED ORDER — SODIUM CHLORIDE 0.9 % IV BOLUS
1000.0000 mL | Freq: Once | INTRAVENOUS | Status: AC
  Administered 2018-09-05: 1000 mL via INTRAVENOUS

## 2018-09-05 MED ORDER — METOCLOPRAMIDE HCL 5 MG/ML IJ SOLN
10.0000 mg | Freq: Once | INTRAMUSCULAR | Status: AC
  Administered 2018-09-05: 10 mg via INTRAVENOUS
  Filled 2018-09-05: qty 2

## 2018-09-05 MED ORDER — DIPHENHYDRAMINE HCL 50 MG/ML IJ SOLN
25.0000 mg | Freq: Once | INTRAMUSCULAR | Status: AC
  Administered 2018-09-05: 25 mg via INTRAVENOUS
  Filled 2018-09-05: qty 1

## 2018-09-05 MED ORDER — CEPHALEXIN 500 MG PO CAPS: 500.0000 mg | ORAL_CAPSULE | Freq: Two times a day (BID) | ORAL | 0 refills | Status: DC

## 2018-09-05 MED ORDER — CEFAZOLIN SODIUM-DEXTROSE 1-4 GM/50ML-% IV SOLN
1.0000 g | Freq: Once | INTRAVENOUS | Status: AC
  Administered 2018-09-05: 1 g via INTRAVENOUS
  Filled 2018-09-05: qty 50

## 2018-09-05 NOTE — ED Triage Notes (Signed)
Patient states she is [redacted]weeks pregnant and has been vomiting since Oct. 24th.  Patient also reports diarrhea.  Patient states she is also now having some chest pain.  Patient went to [facility] and was given reglan, phenergan and bonjesta.  Patient states, "nothing is helping."  Patient reports trying to drink pedialyte, broth, etc. And cannot keep anything down.  Patient states this is her 1st pregnancy.

## 2018-09-05 NOTE — Discharge Instructions (Signed)
Please call and schedule an appointment with [facility].  Clear liquid diet for the next 24 hours then progress as tolerated.  Return to the emergency department for symptoms that change or worsen if unable to schedule an appointment with gynecology.

## 2018-09-05 NOTE — ED Provider Notes (Signed)
[facility] Emergency Department Provider Note  ____________________________________________   None    (approximate)  I have reviewed the triage vital signs and the nursing notes.   HISTORY  Chief Complaint Emesis and Diarrhea   HPI Victoria Becker is a 22 y.o. female presents to the emergency department for treatment and evaluation of persistent vomiting.  Patient states that she has been vomiting since October 24 and has been unable to tolerate any food or fluids since that time.  She has been evaluated here as well as by her primary care provider and has tried taking multiple medications at home without any relief.  She is gravida 1 with her last menstrual cycle being approximately 07/01/2018.  Past Medical History:  Diagnosis Date  . Amenorrhea   . Chronic pelvic pain in female   . Dyspareunia   . Dysphagia   . Endometriosis   . Enlarged thyroid   . Follicular cyst of ovary   . Heart murmur   . Heavy periods   . Hypothyroidism    HASHIMOTO'S  . IBS (irritable bowel syndrome)   . Kidney stone   . PCOS (polycystic ovarian syndrome)   . Pelvic pain in female   . Raynaud disease   . Syncope   . UTI (lower urinary tract infection)     Patient Active Problem List   Diagnosis Date Noted  . Nausea and vomiting during pregnancy 09/01/2018  . Supervision of high risk pregnancy, antepartum 08/17/2018    Past Surgical History:  Procedure Laterality Date  . APPENDECTOMY  10/2013  . CHOLECYSTECTOMY    . COLON SURGERY  08/2013  . WISDOM TOOTH EXTRACTION  2012/2013   ONE    Prior to Admission medications   Medication Sig Start Date End Date Taking? Authorizing Provider  Biotin 10 MG CAPS Take by mouth.    [provider]  cephALEXin (KEFLEX) 500 MG capsule Take 1 capsule (500 mg total) by mouth 2 (two) times daily for 7 days. 09/05/18 09/12/18  Royale Swamy, Rulon Eisenmenger B, FNP  Doxylamine-Pyridoxine ER (BONJESTA) 20-20 MG TBCR Take 1 tablet by mouth  at bedtime. 08/20/18   Oswaldo Conroy, CNM  levothyroxine (SYNTHROID, LEVOTHROID) 125 MCG tablet Take 1 tablet by mouth daily. 07/10/18   [provider]  metFORMIN (GLUCOPHAGE-XR) 500 MG 24 hr tablet Take 1 tablet by mouth daily. 04/05/18 04/05/19  [provider]  metoCLOPramide (REGLAN) 10 MG tablet Take 1 tablet (10 mg total) by mouth 4 (four) times daily as needed for nausea or vomiting. 08/31/18   Oswaldo Conroy, CNM  Prenatal Vit-Fe Fumarate-FA (MULTIVITAMIN-PRENATAL) 27-0.8 MG TABS tablet Take 1 tablet by mouth daily at 12 noon.    [provider]  promethazine (PHENERGAN) 25 MG tablet Take 1 tablet (25 mg total) by mouth every 6 (six) hours as needed for nausea or vomiting. 09/02/18   Darci Current, MD    Allergies Amoxicillin-pot clavulanate; Hydromorphone; and Latex  Family History  Problem Relation Age of Onset  . Diabetes Mother   . Hypertension Mother   . Diabetes Sister        GESTATIONAL  . Hypertension Sister   . Diabetes Maternal Grandmother   . Hypertension Maternal Grandmother   . Stroke Maternal Grandmother   . Heart disease Maternal Grandmother   . Diabetes Paternal Grandmother   . Stroke Paternal Grandmother   . Heart disease Paternal Grandmother   . Cancer Paternal Grandfather        BRAIN  .  Cancer Paternal Aunt 74       BREAST    Social History Social History   Tobacco Use  . Smoking status: Never Smoker  . Smokeless tobacco: Never Used  Substance Use Topics  . Alcohol use: No  . Drug use: No    Review of Systems  Constitutional: No fever/chills Eyes: No visual changes. ENT: No sore throat. Cardiovascular: Denies chest pain. Respiratory: Denies shortness of breath. Gastrointestinal: No abdominal pain.  No nausea, no vomiting.  No diarrhea.  No constipation. Genitourinary: Negative for dysuria. Musculoskeletal: Negative for back pain. Skin: Negative for rash. Neurological: Negative for headaches, focal  weakness or numbness. ____________________________________________   PHYSICAL EXAM:  VITAL SIGNS: ED Triage Vitals  Enc Vitals Group     BP 09/05/18 1532 121/78     Pulse Rate 09/05/18 1532 91     Resp 09/05/18 1532 (!) 24     Temp 09/05/18 1532 97.6 F (36.4 C)     Temp Source 09/05/18 1532 Oral     SpO2 09/05/18 1532 100 %     Weight 09/05/18 1533 155 lb (70.3 kg)     Height 09/05/18 1533 4\' 9"  (1.448 m)     Head Circumference --      Peak Flow --      Pain Score 09/05/18 1539 3     Pain Loc --      Pain Edu? --      Excl. in GC? --     Constitutional: Alert and oriented. Well appearing and in no acute distress. Eyes: Conjunctivae are normal. PERRL. EOMI. Head: Atraumatic. Nose: No congestion/rhinnorhea. Mouth/Throat: Mucous membranes are moist.  Oropharynx non-erythematous. Neck: No stridor.   Cardiovascular: Normal rate, regular rhythm. Grossly normal heart sounds.  Good peripheral circulation. Respiratory: Normal respiratory effort.  No retractions. Lungs CTAB. Gastrointestinal: Soft and nontender. No distention. No abdominal bruits. No CVA tenderness. Musculoskeletal: No lower extremity tenderness nor edema.  No joint effusions. Neurologic:  Normal speech and language. No gross focal neurologic deficits are appreciated. No gait instability. Skin:  Skin is warm, dry and intact. No rash noted. Psychiatric: Mood and affect are normal. Speech and behavior are normal.  ____________________________________________   LABS (all labs ordered are listed, but only abnormal results are displayed)  Labs Reviewed  COMPREHENSIVE METABOLIC PANEL - Abnormal; Notable for the following components:      Result Value   CO2 21 (*)    Total Protein 8.2 (*)    ALT 74 (*)    All other components within normal limits  CBC - Abnormal; Notable for the following components:   WBC 11.0 (*)    All other components within normal limits  URINALYSIS, COMPLETE (UACMP) WITH MICROSCOPIC -  Abnormal; Notable for the following components:   Color, Urine AMBER (*)    APPearance HAZY (*)    Specific Gravity, Urine 1.031 (*)    Ketones, ur 80 (*)    Protein, ur 30 (*)    Bacteria, UA RARE (*)    All other components within normal limits  HCG, QUANTITATIVE, PREGNANCY - Abnormal; Notable for the following components:   hCG, Beta Chain, Quant, S 250,100 (*)    All other components within normal limits  LIPASE, BLOOD   ____________________________________________  EKG  ED ECG REPORT I, Kem Boroughs, the attending physician, personally viewed and interpreted this ECG.   Date: 09/05/2018  EKG Time: 3:42 PM  Rate: 88  Rhythm: normal EKG, normal sinus rhythm  Axis:  No axis deviation  Intervals:none  ST&T Change: No ST elevation  ____________________________________________  RADIOLOGY  ED MD interpretation: None  Official radiology report(s): No results found.  ____________________________________________   PROCEDURES  Procedure(s) performed: None  Procedures  Critical Care performed: No  ____________________________________________   INITIAL IMPRESSION / ASSESSMENT AND PLAN / ED COURSE  As part of my medical decision making, I reviewed the following data within the electronic MEDICAL RECORD NUMBER Notes from prior ED visits  19-year-old female presenting to the emergency department for treatment and evaluation of vomiting since August 26, 2018.  She was evaluated here as well as her gynecologist and has had no relief with any of the interventions recommended.  Upon entering the room, the patient was actively vomiting. ----------------------------------------- 8:07 PM on 09/05/2018 -----------------------------------------  No improvement with Reglan. Benadryl ordered. IV fluids infusing.  She will also receive Ancef for her acute cystitis.  Patient up to restroom at this time.   ----------------------------------------- 11:21 PM on  09/05/2018 -----------------------------------------  Patient received 2 L of IV fluids, Reglan, and Benadryl.  After the Benadryl the vomiting stopped and the patient was able to tolerate a cup of ice chips without feeling extremely nauseated or vomiting.  Patient stated that she felt that she was ready to go home.  We discussed calling her gynecologist and requesting an appointment for this week.  She was encouraged to start with a clear liquid diet for the 24-hour period.  She was encouraged to use Benadryl as well.  She was advised that she should return to the emergency department if she again becomes unable to tolerate food or fluids.   ____________________________________________   FINAL CLINICAL IMPRESSION(S) / ED DIAGNOSES  Final diagnoses:  Hyperemesis gravidarum  Acute cystitis without hematuria     ED Discharge Orders         Ordered    cephALEXin (KEFLEX) 500 MG capsule  2 times daily     09/05/18 2219           Note:  This document was prepared using Dragon voice recognition software and may include unintentional dictation errors.    Chinita Pester, FNP 09/05/18 2324    Emily Filbert, MD 09/06/18 2126

## 2018-09-06 ENCOUNTER — Encounter: Payer: Managed Care, Other (non HMO) | Admitting: Advanced Practice Midwife

## 2018-09-06 ENCOUNTER — Ambulatory Visit (INDEPENDENT_AMBULATORY_CARE_PROVIDER_SITE_OTHER): Payer: Managed Care, Other (non HMO) | Admitting: Obstetrics and Gynecology

## 2018-09-06 VITALS — BP 116/78 | Wt 154.0 lb

## 2018-09-06 DIAGNOSIS — Z3A08 8 weeks gestation of pregnancy: Secondary | ICD-10-CM

## 2018-09-06 DIAGNOSIS — O210 Mild hyperemesis gravidarum: Secondary | ICD-10-CM

## 2018-09-06 DIAGNOSIS — O0990 Supervision of high risk pregnancy, unspecified, unspecified trimester: Secondary | ICD-10-CM

## 2018-09-06 MED ORDER — ONDANSETRON 4 MG PO TBDP: 4.0000 mg | ORAL_TABLET | Freq: Four times a day (QID) | ORAL | 0 refills | Status: DC | PRN

## 2018-09-06 MED ORDER — PROMETHAZINE HCL 25 MG RE SUPP: 25.0000 mg | Freq: Four times a day (QID) | RECTAL | 0 refills | Status: DC | PRN

## 2018-09-06 NOTE — Progress Notes (Signed)
ROB N&V/ ER follow up

## 2018-09-07 ENCOUNTER — Telehealth: Payer: Self-pay

## 2018-09-07 NOTE — Telephone Encounter (Signed)
Pt called after hour nurse stating her pharm won't have the phenergan supp until last week.  After hour nurse was informed that she can have her rx transferred from [facility] to another pharm that has phenergan.

## 2018-09-10 ENCOUNTER — Other Ambulatory Visit: Payer: Self-pay

## 2018-09-10 ENCOUNTER — Ambulatory Visit (INDEPENDENT_AMBULATORY_CARE_PROVIDER_SITE_OTHER): Payer: Managed Care, Other (non HMO) | Admitting: Obstetrics and Gynecology

## 2018-09-10 ENCOUNTER — Observation Stay
Admission: AD | Admit: 2018-09-10 | Discharge: 2018-09-12 | Disposition: A | Discharge status: Home / Self Care | Payer: Managed Care, Other (non HMO) | Source: Ambulatory Visit | Attending: Obstetrics and Gynecology | Admitting: Obstetrics and Gynecology

## 2018-09-10 VITALS — BP 112/52 | Wt 148.0 lb

## 2018-09-10 DIAGNOSIS — O0990 Supervision of high risk pregnancy, unspecified, unspecified trimester: Secondary | ICD-10-CM

## 2018-09-10 DIAGNOSIS — Z3A09 9 weeks gestation of pregnancy: Secondary | ICD-10-CM | POA: Insufficient documentation

## 2018-09-10 DIAGNOSIS — O210 Mild hyperemesis gravidarum: Secondary | ICD-10-CM

## 2018-09-10 LAB — COMPREHENSIVE METABOLIC PANEL
ALK PHOS: 53 U/L (ref 38–126)
ALT: 51 U/L — ABNORMAL HIGH (ref 0–44)
ANION GAP: 13 (ref 5–15)
AST: 27 U/L (ref 15–41)
Albumin: 4.5 g/dL (ref 3.5–5.0)
BUN: 11 mg/dL (ref 6–20)
CO2: 21 mmol/L — AB (ref 22–32)
CREATININE: 0.68 mg/dL (ref 0.44–1.00)
Calcium: 9.9 mg/dL (ref 8.9–10.3)
Chloride: 106 mmol/L (ref 98–111)
Glucose, Bld: 91 mg/dL (ref 70–99)
Potassium: 4.2 mmol/L (ref 3.5–5.1)
SODIUM: 140 mmol/L (ref 135–145)
Total Bilirubin: 1.3 mg/dL — ABNORMAL HIGH (ref 0.3–1.2)
Total Protein: 8.5 g/dL — ABNORMAL HIGH (ref 6.5–8.1)

## 2018-09-10 LAB — TSH: TSH: 1.852 u[IU]/mL (ref 0.350–4.500)

## 2018-09-10 LAB — POCT URINALYSIS DIPSTICK OB
BILIRUBIN UA: NEGATIVE
Glucose, UA: NEGATIVE
Leukocytes, UA: NEGATIVE
Nitrite, UA: NEGATIVE
RBC UA: NEGATIVE
SPEC GRAV UA: 1.02 (ref 1.010–1.025)
UROBILINOGEN UA: NEGATIVE U/dL — AB
pH, UA: 7 (ref 5.0–8.0)

## 2018-09-10 LAB — MAGNESIUM: MAGNESIUM: 2.1 mg/dL (ref 1.7–2.4)

## 2018-09-10 LAB — T4, FREE: FREE T4: 0.95 ng/dL (ref 0.82–1.77)

## 2018-09-10 MED ORDER — ONDANSETRON HCL 4 MG/2ML IJ SOLN
4.0000 mg | Freq: Four times a day (QID) | INTRAMUSCULAR | Status: DC | PRN
  Administered 2018-09-10 – 2018-09-11 (×5): 4 mg via INTRAVENOUS
  Filled 2018-09-10 (×6): qty 2

## 2018-09-10 MED ORDER — METHYLPREDNISOLONE SODIUM SUCC 125 MG IJ SOLR
48.0000 mg | Freq: Once | INTRAMUSCULAR | Status: AC
  Administered 2018-09-10: 48 mg via INTRAVENOUS
  Filled 2018-09-10: qty 2

## 2018-09-10 MED ORDER — METHYLPREDNISOLONE 4 MG PO TBPK
8.0000 mg | ORAL_TABLET | Freq: Every day | ORAL | Status: DC
  Filled 2018-09-10: qty 21

## 2018-09-10 MED ORDER — DEXTROSE-NACL 5-0.45 % IV SOLN
INTRAVENOUS | Status: DC
  Administered 2018-09-10 – 2018-09-12 (×5): via INTRAVENOUS

## 2018-09-10 MED ORDER — DEXTROSE-NACL 5-0.45 % IV SOLN: INTRAVENOUS | Status: DC

## 2018-09-10 MED ORDER — METHYLPREDNISOLONE 4 MG PO TABS
8.0000 mg | ORAL_TABLET | Freq: Every day | ORAL | Status: DC
  Filled 2018-09-10: qty 2

## 2018-09-10 MED ORDER — METHYLPREDNISOLONE 4 MG PO TBPK
16.0000 mg | ORAL_TABLET | Freq: Every day | ORAL | Status: DC
  Administered 2018-09-11 – 2018-09-12 (×2): 16 mg via ORAL
  Filled 2018-09-10: qty 21

## 2018-09-10 MED ORDER — FAMOTIDINE IN NACL 20-0.9 MG/50ML-% IV SOLN
20.0000 mg | Freq: Two times a day (BID) | INTRAVENOUS | Status: DC
  Administered 2018-09-10 – 2018-09-12 (×5): 20 mg via INTRAVENOUS
  Filled 2018-09-10 (×7): qty 50

## 2018-09-10 MED ORDER — SODIUM CHLORIDE 0.9 % IV BOLUS
1000.0000 mL | Freq: Once | INTRAVENOUS | Status: AC
  Administered 2018-09-10: 1000 mL via INTRAVENOUS

## 2018-09-10 MED ORDER — METHYLPREDNISOLONE 4 MG PO TABS
4.0000 mg | ORAL_TABLET | Freq: Every day | ORAL | Status: DC
  Filled 2018-09-10: qty 1

## 2018-09-10 MED ORDER — M.V.I. ADULT IV INJ
Freq: Every day | INTRAVENOUS | Status: DC
  Administered 2018-09-10 – 2018-09-11 (×2): via INTRAVENOUS
  Filled 2018-09-10 (×2): qty 10

## 2018-09-10 MED ORDER — METOCLOPRAMIDE HCL 5 MG/ML IJ SOLN: 10.0000 mg | Freq: Four times a day (QID) | INTRAMUSCULAR | Status: DC | PRN

## 2018-09-10 MED ORDER — KCL IN DEXTROSE-NACL 20-5-0.45 MEQ/L-%-% IV SOLN
INTRAVENOUS | Status: DC
  Filled 2018-09-10 (×4): qty 1000

## 2018-09-10 MED ORDER — METHYLPREDNISOLONE 4 MG PO TBPK
16.0000 mg | ORAL_TABLET | Freq: Every day | ORAL | Status: AC
  Administered 2018-09-11 – 2018-09-12 (×2): 16 mg via ORAL
  Filled 2018-09-10: qty 21

## 2018-09-10 MED ORDER — METHYLPREDNISOLONE 4 MG PO TBPK
4.0000 mg | ORAL_TABLET | Freq: Every day | ORAL | Status: DC
  Filled 2018-09-10: qty 21

## 2018-09-10 MED ORDER — METHYLPREDNISOLONE 16 MG PO TABS
16.0000 mg | ORAL_TABLET | Freq: Every day | ORAL | Status: DC
  Administered 2018-09-11 – 2018-09-12 (×2): 16 mg via ORAL
  Filled 2018-09-10 (×2): qty 1

## 2018-09-10 NOTE — Progress Notes (Signed)
Obstetric H&P   Chief Complaint: Hyperemesis  Prenatal Care Provider: [facility]  History of Present Illness: 22 y.o. G1P0000 [redacted]w[redacted]d by 04/13/2019, by Ultrasound presenting for follow up of hyperemesis.  Has had several ER visits already with no significant improvement.  Was seen 11/4 in office.  At that time declines admission and was started on Zofran ODT and phenergan suppositories.  Has had minimal improvement since that time with continued emesis and po intolerance.  Based on continued symptoms, as well as 4+ ketones will direct admit.  Pregravid weight 164 lb (74.4 kg) Total Weight Gain -16 lb (-7.258 kg)  FIRST Problems (from 08/17/18 to present)    Problem Noted Resolved   Supervision of high risk pregnancy, antepartum 08/17/2018 by Oswaldo Conroy, CNM No   Overview Signed 08/31/2018  3:27 PM by Oswaldo Conroy, CNM    [facility]inic [facility] Prenatal Labs  Dating  Blood type: O/Positive/-- (10/15 1622)   Genetic Screen 1 Screen:    AFP:     Quad:     NIPS: Antibody:Negative (10/15 1622)  Anatomic [place]  Rubella: 2.14 (10/15 1622) Varicella:    GTT Early:               Third trimester:  RPR: Non Reactive (10/15 1622)   Rhogam  HBsAg: Negative (10/15 1622)   TDaP vaccine                       Flu Shot: HIV: Non Reactive (10/15 1622)   Baby Food                                GBS:   Contraception  Pap:  CBB     CS/VBAC    Support Person                  Review of Systems: 10 point review of systems negative unless otherwise noted in HPI  Past Medical History: Past Medical History:  Diagnosis Date  . Amenorrhea   . Chronic pelvic pain in female   . Dyspareunia   . Dysphagia   . Endometriosis   . Enlarged thyroid   . Follicular cyst of ovary   . Heart murmur   . Heavy periods   . Hypothyroidism    HASHIMOTO'S  . IBS (irritable bowel syndrome)   . Kidney stone   . PCOS (polycystic ovarian syndrome)   . Pelvic pain in female   . Raynaud disease   . Syncope   . UTI  (lower urinary tract infection)     Past Surgical History: Past Surgical History:  Procedure Laterality Date  . APPENDECTOMY  10/2013  . CHOLECYSTECTOMY    . COLON SURGERY  08/2013  . WISDOM TOOTH EXTRACTION  2012/2013   ONE    Past Obstetric History: #: 1, Date: None, Sex: None, Weight: None, GA: None, Delivery: None, Apgar1: None, Apgar5: None, Living: None, Birth Comments: None   Past Gynecologic History:  Family History: Family History  Problem Relation Age of Onset  . Diabetes Mother   . Hypertension Mother   . Diabetes Sister        GESTATIONAL  . Hypertension Sister   . Diabetes Maternal Grandmother   . Hypertension Maternal Grandmother   . Stroke Maternal Grandmother   . Heart disease Maternal Grandmother   . Diabetes Paternal Grandmother   . Stroke Paternal Grandmother   .  Heart disease Paternal Grandmother   . Cancer Paternal Grandfather        BRAIN  . Cancer Paternal Aunt 80       BREAST    Social History: Social History   Socioeconomic History  . Marital status: Single    Spouse name: Not on file  . Number of children: 0  . Years of education: 76  . Highest education level: Not on file  Occupational History  . Occupation: CNA  . Occupation: STUDENT    Comment: [employer]  Social Needs  . Financial resource strain: Not on file  . Food insecurity:    Worry: Not on file    Inability: Not on file  . Transportation needs:    Medical: Not on file    Non-medical: Not on file  Tobacco Use  . Smoking status: Never Smoker  . Smokeless tobacco: Never Used  Substance and Sexual Activity  . Alcohol use: No  . Drug use: No  . Sexual activity: Yes    Birth control/protection: None  Lifestyle  . Physical activity:    Days per week: Not on file    Minutes per session: Not on file  . Stress: Not on file  Relationships  . Social connections:    Talks on phone: Not on file    Gets together: Not on file    Attends religious service: Not on file     Active member of club or organization: Not on file    Attends meetings of clubs or organizations: Not on file    Relationship status: Not on file  . Intimate partner violence:    Fear of current or ex partner: Not on file    Emotionally abused: Not on file    Physically abused: Not on file    Forced sexual activity: Not on file  Other Topics Concern  . Not on file  Social History Narrative  . Not on file    Medications: Prior to Admission medications   Medication Sig Start Date End Date Taking? Authorizing Provider  Biotin 10 MG CAPS Take by mouth.    [provider]  cephALEXin (KEFLEX) 500 MG capsule Take 1 capsule (500 mg total) by mouth 2 (two) times daily for 7 days. 09/05/18 09/12/18  Triplett, Rulon Eisenmenger B, FNP  Doxylamine-Pyridoxine ER (BONJESTA) 20-20 MG TBCR Take 1 tablet by mouth at bedtime. 08/20/18   Oswaldo Conroy, CNM  levothyroxine (SYNTHROID, LEVOTHROID) 125 MCG tablet Take 1 tablet by mouth daily. 07/10/18   [provider]  metFORMIN (GLUCOPHAGE-XR) 500 MG 24 hr tablet Take 1 tablet by mouth daily. 04/05/18 04/05/19  [provider]  metoCLOPramide (REGLAN) 10 MG tablet Take 1 tablet (10 mg total) by mouth 4 (four) times daily as needed for nausea or vomiting. 08/31/18   Oswaldo Conroy, CNM  ondansetron (ZOFRAN ODT) 4 MG disintegrating tablet Take 1 tablet (4 mg total) by mouth every 6 (six) hours as needed for nausea. 09/06/18   Vena Austria, MD  Prenatal Vit-Fe Fumarate-FA (MULTIVITAMIN-PRENATAL) 27-0.8 MG TABS tablet Take 1 tablet by mouth daily at 12 noon.    [provider]  promethazine (PHENERGAN) 25 MG suppository Place 1 suppository (25 mg total) rectally every 6 (six) hours as needed for nausea or vomiting. 09/06/18   Vena Austria, MD  promethazine (PHENERGAN) 25 MG tablet Take 1 tablet (25 mg total) by mouth every 6 (six) hours as needed for nausea or vomiting. 09/02/18   Darci Current,  MD    Allergies: Allergies   Allergen Reactions  . Amoxicillin-Pot Clavulanate Other (See Comments) and Nausea And Vomiting    Projectile Vomiting vomitting Projectile Vomiting   . Hydromorphone Other (See Comments)    Migraine headache Migraine headache   . Latex     Physical Exam: Vitals: Blood pressure (!) 112/52, weight 148 lb (67.1 kg), last menstrual period 07/01/2018.  General: Actively retching with emesis bag in hand HEENT: normocephalic, anicteric Pulmonary: No increased work of breathing Cardiovascular: RRR, distal pulses 2+ Abdomen: Gravid, non-tender Extremities: no edema, erythema, or tenderness Neurologic: Grossly intact Psychiatric: mood appropriate, affect full  Labs: Results for orders placed or performed in visit on 09/10/18 (from the past 24 hour(s))  POC Urinalysis Dipstick OB     Status: Abnormal   Collection Time: 09/10/18 11:23 AM  Result Value Ref Range   Color, UA Dark    Clarity, UA Clear    Glucose, UA Negative Negative   Bilirubin, UA Negative    Ketones, UA 4+    Spec Grav, UA 1.020 1.010 - 1.025   Blood, UA Negative    pH, UA 7.0 5.0 - 8.0   POC,PROTEIN,UA Large (3+) (A) Negative, Trace   Urobilinogen, UA negative (A) 0.2 or 1.0 E.U./dL   Nitrite, UA Negative    Leukocytes, UA Negative Negative   Appearance     Odor      Assessment: 22 y.o. G1P0000 [redacted]w[redacted]d by 04/13/2019, by Ultrasound with hyperemesis gravidarum  Plan: 1) Hyperemesis - CMP and Magnesium level, replete as needed - NS 1L bolus then D5 1/2NS +73mEq KCL - Once daily MVI infusion - Zofran and reglan IV - Consider adding antacid - Steroid taper  2) Fetus - previable no monitoring  3) DVT ppx - ambulatory  4) Disposition - pending clinical improvement  Vena Austria, MD, [facility], [facility] 09/10/2018, 12:13 PM

## 2018-09-10 NOTE — Progress Notes (Signed)
ROB N&V/long dip showed 4+ Ketones

## 2018-09-11 DIAGNOSIS — O210 Mild hyperemesis gravidarum: Secondary | ICD-10-CM

## 2018-09-11 LAB — GLUCOSE, RANDOM: GLUCOSE: 126 mg/dL — AB (ref 70–99)

## 2018-09-11 MED ORDER — LEVOTHYROXINE SODIUM 125 MCG PO TABS: 125.0000 ug | ORAL_TABLET | Freq: Every day | ORAL | Status: DC

## 2018-09-11 MED ORDER — LEVOTHYROXINE SODIUM 125 MCG PO TABS
125.0000 ug | ORAL_TABLET | Freq: Every day | ORAL | Status: DC
  Administered 2018-09-11 – 2018-09-12 (×2): 125 ug via ORAL
  Filled 2018-09-11 (×3): qty 1

## 2018-09-11 NOTE — Progress Notes (Signed)
Vitals have been stable and WDL throughout the day. Patient complained of nausea this morning and requested Zofran. Patient has only had Zofran once this shift. Patient has tolerated PO medicine throughout the day with no vomiting. Patient states she still feels nauseous but that it's better than it was before. . Voiding adequately. No other complaints. RN to continue to monitor.

## 2018-09-11 NOTE — Progress Notes (Signed)
Daily Antepartum Note  Admission Date: 09/10/2018 Current Date: 09/11/2018 10:01 AM  Victoria Becker is a 22 y.o. G1P0000 @ [redacted]w[redacted]d by ultrasound admitted for hyperemesis  Pregnancy complicated by:  Patient Active Problem List   Diagnosis Date Noted  . Hyperemesis gravidarum 09/10/2018  . Nausea and vomiting during pregnancy 09/01/2018  . Supervision of high risk pregnancy, antepartum 08/17/2018    Overnight/24hr events:  IV fluids with mineral repletion, antinausea medications, steroid taper  Subjective:  She reports decrease in the number of times she is vomiting. This morning she has had some dry heaves but no emesis. She does not want to take Reglan as it nauseates her. She is requesting her AM dose of synthroid. She is unable to consider PO food intake at this time. She reports noting a spot of blood when wiping this morning.   Objective:   Vitals:   09/10/18 2256 09/11/18 0821  BP: (!) 99/55 99/72  Pulse: (!) 101 68  Resp: 18 18  Temp: 97.9 F (36.6 C) 98 F (36.7 C)  SpO2: 100% 99%   Temp:  [97.6 F (36.4 C)-98.1 F (36.7 C)] 98 F (36.7 C) (11/09 0821) Pulse Rate:  [68-101] 68 (11/09 0821) Resp:  [18-20] 18 (11/09 0821) BP: (99-122)/(52-92) 99/72 (11/09 0821) SpO2:  [99 %-100 %] 99 % (11/09 0821) Weight:  [67.1 kg] 67.1 kg (11/08 1220) Temp (24hrs), Avg:97.9 F (36.6 C), Min:97.6 F (36.4 C), Max:98.1 F (36.7 C)   Intake/Output Summary (Last 24 hours) at 09/11/2018 1001 Last data filed at 09/10/2018 2153 Gross per 24 hour  Intake 2000.01 ml  Output 400 ml  Net 1600.01 ml     Current Vital Signs 24h Vital Sign Ranges  T 98 F (36.7 C) Temp  Avg: 97.9 F (36.6 C)  Min: 97.6 F (36.4 C)  Max: 98.1 F (36.7 C)  BP 99/72 BP  Min: 99/72  Max: 122/92  HR 68 Pulse  Avg: 83.6  Min: 68  Max: 101  RR 18 Resp  Avg: 18.4  Min: 18  Max: 20  SaO2 99 % Room Air SpO2  Avg: 99.8 %  Min: 99 %  Max: 100 %       24 Hour I/O Current Shift I/O  Time Ins Outs 11/08 0701 -  11/09 0700 In: 2000 [I.V.:850] Out: 400 [Urine:400] No intake/output data recorded.   Patient Vitals for the past 24 hrs:  BP Temp Temp src Pulse Resp SpO2 Height Weight  09/11/18 0821 99/72 98 F (36.7 C) Oral 68 18 99 % - -  09/10/18 2256 (!) 99/55 97.9 F (36.6 C) Oral (!) 101 18 100 % - -  09/10/18 1915 104/73 98.1 F (36.7 C) Oral 82 20 100 % - -  09/10/18 1603 113/75 97.8 F (36.6 C) Oral 75 18 100 % - -  09/10/18 1220 (!) 122/92 97.6 F (36.4 C) Axillary 92 18 100 % 4\' 9"  (1.448 m) 67.1 kg    Physical exam: General: Well nourished, well developed female in no acute distress. Abdomen: gravid  Cardiovascular: regular rate and rhythm Respiratory: CTAB Extremities: no clubbing, cyanosis or edema Skin: Warm and dry.   Medications: Current Facility-Administered Medications  Medication Dose Route Frequency Provider Last Rate Last Dose  . dextrose 5 %-0.45 % sodium chloride infusion   Intravenous Continuous Vena Austria, MD 125 mL/hr at 09/11/18 0615    . famotidine (PEPCID) IVPB 20 mg premix  20 mg Intravenous Q12H Farrel Conners, CNM 100 mL/hr at 09/10/18  2152 20 mg at 09/10/18 2152  . [START ON 09/12/2018] levothyroxine (SYNTHROID, LEVOTHROID) tablet 125 mcg  125 mcg Oral QAC breakfast Tresea Mall, CNM      . methylPREDNISolone (MEDROL DOSEPAK) tablet 16 mg  16 mg Oral Q breakfast Vena Austria, MD   16 mg at 09/11/18 0851   Followed by  . [START ON 09/15/2018] methylPREDNISolone (MEDROL DOSEPAK) tablet 8 mg  8 mg Oral Q breakfast Vena Austria, MD       Followed by  . [START ON 09/22/2018] methylPREDNISolone (MEDROL DOSEPAK) tablet 4 mg  4 mg Oral Q breakfast Vena Austria, MD      . methylPREDNISolone (MEDROL DOSEPAK) tablet 16 mg  16 mg Oral Q1400 Vena Austria, MD       Followed by  . [START ON 09/13/2018] methylPREDNISolone (MEDROL) tablet 8 mg  8 mg Oral Q1400 Vena Austria, MD       Followed by  . [START ON 09/16/2018]  methylPREDNISolone (MEDROL) tablet 4 mg  4 mg Oral Q1400 Vena Austria, MD      . methylPREDNISolone (MEDROL) tablet 16 mg  16 mg Oral QHS Vena Austria, MD       Followed by  . [START ON 09/14/2018] methylPREDNISolone (MEDROL DOSEPAK) tablet 8 mg  8 mg Oral QHS Vena Austria, MD       Followed by  . [START ON 09/17/2018] methylPREDNISolone (MEDROL) tablet 4 mg  4 mg Oral QHS Vena Austria, MD      . metoCLOPramide (REGLAN) injection 10 mg  10 mg Intravenous Q6H PRN Vena Austria, MD      . multivitamins adult (MVI -12) 10 mL in dextrose 5% lactated ringers 1,000 mL infusion   Intravenous Daily Vena Austria, MD   Stopped at 09/10/18 2152  . ondansetron (ZOFRAN) injection 4 mg  4 mg Intravenous Q6H PRN Vena Austria, MD   4 mg at 09/11/18 0846    Labs:  Recent Labs  Lab 09/05/18 1545  WBC 11.0*  HGB 14.7  HCT 43.6  PLT 361    Recent Labs  Lab 09/05/18 1545 09/10/18 1325 09/11/18 0623  NA 137 140  --   K 4.3 4.2  --   CL 104 106  --   CO2 21* 21*  --   BUN 8 11  --   CREATININE 0.67 0.68  --   CALCIUM 9.9 9.9  --   PROT 8.2* 8.5*  --   BILITOT 1.2 1.3*  --   ALKPHOS 56 53  --   ALT 74* 51*  --   AST 40 27  --   GLUCOSE 79 91 126*     Assessment & Plan:   22 yo G1 P0 female [redacted]w[redacted]d by ultrasound, EDD 04/13/2019 with hyperemesis  1. Continue IV fluids D5 1/2NS + KCL,  2. Once daily MVI infusion 3. Zofran IV 4. Steroid taper 5. Synthroid 125 mcg daily before breakfast 6. Fetus- previable no monitoring 7. DVT ppx- ambulatory 8. Advance diet as tolerated 9. Disposition: pending clinical improvement   Tresea Mall, CNM [facility], [facility]

## 2018-09-12 DIAGNOSIS — O210 Mild hyperemesis gravidarum: Secondary | ICD-10-CM

## 2018-09-12 LAB — GLUCOSE, RANDOM: GLUCOSE: 148 mg/dL — AB (ref 70–99)

## 2018-09-12 MED ORDER — SIMETHICONE 80 MG PO CHEW
80.0000 mg | CHEWABLE_TABLET | Freq: Four times a day (QID) | ORAL | Status: DC | PRN
  Administered 2018-09-12: 80 mg via ORAL
  Filled 2018-09-12: qty 1

## 2018-09-12 MED ORDER — METHYLPREDNISOLONE 4 MG PO TBPK: ORAL_TABLET | ORAL | 0 refills | Status: DC

## 2018-09-12 MED ORDER — METHYLPREDNISOLONE 8 MG PO TABS: ORAL_TABLET | ORAL | 0 refills | Status: DC

## 2018-09-12 MED ORDER — METHYLPREDNISOLONE 4 MG PO TABS: ORAL_TABLET | ORAL | 0 refills | Status: DC

## 2018-09-12 NOTE — Progress Notes (Signed)
09/12/2018 9:48 PM  BP 104/66   Pulse 81   Temp 98.1 F (36.7 C) (Oral)   Resp 20   Ht 4\' 9"  (144.8 cm)   Wt 67.1 kg   LMP 07/01/2018 (Approximate)   SpO2 100%   BMI 32.03 kg/m  Patient discharged per MD orders. Discharge instructions reviewed with patient and patient verbalized understanding. IV removed per policy. Prescriptions discussed and given to patient. Discharged via wheelchair escorted by nursing staff.  Ron Parker, RN

## 2018-09-12 NOTE — Discharge Summary (Signed)
Antenatal Discharge     Patient Name: Victoria Becker DOB: 09/12/1996 MRN: [id number]  Date of admission: 09/10/2018  Date of discharge: 09/12/2018  Admitting diagnosis: Hyperemisis Intrauterine pregnancy: [redacted]w[redacted]d     Secondary diagnosis: None     Discharge diagnosis: 9 week IUP Hyperemesis improving                                                                                               Hospital course:  Patient was admitted on 09/10/2018. She has received IV fluids, IV antiemetics, oral steroid taper, and has advanced to a regular diet.   Past Medical History:     Past Medical History:  Diagnosis Date  . Amenorrhea   . Chronic pelvic pain in female   . Dyspareunia   . Dysphagia   . Endometriosis   . Enlarged thyroid   . Follicular cyst of ovary   . Heart murmur   . Heavy periods   . Hypothyroidism    HASHIMOTO'S  . IBS (irritable bowel syndrome)   . Kidney stone   . PCOS (polycystic ovarian syndrome)   . Pelvic pain in female   . Raynaud disease   . Syncope   . UTI (lower urinary tract infection)     Past Surgical History:      Past Surgical History:  Procedure Laterality Date  . APPENDECTOMY  10/2013  . CHOLECYSTECTOMY    . COLON SURGERY  08/2013  . WISDOM TOOTH EXTRACTION  2012/2013   ONE   Family History:      Family History  Problem Relation Age of Onset  . Diabetes Mother   . Hypertension Mother   . Diabetes Sister        GESTATIONAL  . Hypertension Sister   . Diabetes Maternal Grandmother   . Hypertension Maternal Grandmother   . Stroke Maternal Grandmother   . Heart disease Maternal Grandmother   . Diabetes Paternal Grandmother   . Stroke Paternal Grandmother   . Heart disease Paternal Grandmother   . Cancer Paternal Grandfather        BRAIN  . Cancer Paternal Aunt 69       BREAST     Physical exam  Vitals:   09/12/18 0806 09/12/18 1228 09/12/18 1656 09/12/18 1926  BP: 100/62 110/60 111/75  104/66  Pulse: 80 74 80 81  Resp: 18 18 20 20   Temp: 98.8 F (37.1 C) 98.6 F (37 C) 98.9 F (37.2 C) 98.1 F (36.7 C)  TempSrc: Oral Oral Oral Oral  SpO2: 99% 98% 97% 100%  Weight:      Height:       Vital Signs: BP 104/66   Pulse 81   Temp 98.1 F (36.7 C) (Oral)   Resp 20   Ht 4\' 9"  (1.448 m)   Wt 67.1 kg   LMP 07/01/2018 (Approximate)   SpO2 100%   BMI 32.03 kg/m  Constitutional: Well nourished, well developed female in no acute distress.  HEENT: normal Skin: Warm and dry.  Cardiovascular: Regular rate and rhythm.   Respiratory: Clear to auscultation bilateral. Normal respiratory effort Abdomen: gravid  Psych: Alert and Oriented x3. No memory deficits. Normal mood and affect.     Labs: Lab Results  Component Value Date   WBC 11.0 (H) 09/05/2018   HGB 14.7 09/05/2018   HCT 43.6 09/05/2018   MCV 89.2 09/05/2018   PLT 361 09/05/2018    Discharge instruction: per After Visit Summary.  Medications:  Allergies as of 09/12/2018      Reactions   Amoxicillin-pot Clavulanate Other (See Comments), Nausea And Vomiting   Projectile Vomiting vomitting Projectile Vomiting   Hydromorphone Other (See Comments)   Migraine headache Migraine headache   Latex       Medication List    STOP taking these medications   cephALEXin 500 MG capsule Commonly known as:  KEFLEX   metoCLOPramide 10 MG tablet Commonly known as:  REGLAN     TAKE these medications   Biotin 10 MG Caps Take by mouth.   Doxylamine-Pyridoxine ER 20-20 MG Tbcr Take 1 tablet by mouth at bedtime.   levothyroxine 125 MCG tablet Commonly known as:  SYNTHROID, LEVOTHROID Take 1 tablet by mouth daily.   metFORMIN 500 MG 24 hr tablet Commonly known as:  GLUCOPHAGE-XR Take 1 tablet by mouth daily.   methylPREDNISolone 4 MG Tbpk tablet Commonly known as:  MEDROL DOSEPAK Take 4 tablets (16 mg total) by mouth daily with breakfast for 2 days, THEN 2 tablets (8 mg total) daily with breakfast for 7  days, THEN 1 tablet (4 mg total) daily with breakfast for 2 days. Start taking on:  09/13/2018   methylPREDNISolone 4 MG tablet Commonly known as:  MEDROL Take 4 tablets (16 mg total) by mouth at bedtime for 1 day, THEN 2 tablets (8 mg total) at bedtime for 3 days, THEN 1 tablet (4 mg total) at bedtime for 1 day. Start taking on:  09/13/2018   methylPREDNISolone 8 MG tablet Commonly known as:  MEDROL Take 1 tablet (8 mg total) by mouth daily at 2 PM for 3 days, THEN 0.5 tablets (4 mg total) daily at 2 PM for 4 days. Start taking on:  09/13/2018   multivitamin-prenatal 27-0.8 MG Tabs tablet Take 1 tablet by mouth daily at 12 noon.   ondansetron 4 MG disintegrating tablet Commonly known as:  ZOFRAN-ODT Take 1 tablet (4 mg total) by mouth every 6 (six) hours as needed for nausea.   promethazine 25 MG tablet Commonly known as:  PHENERGAN Take 1 tablet (25 mg total) by mouth every 6 (six) hours as needed for nausea or vomiting.   promethazine 25 MG suppository Commonly known as:  PHENERGAN Place 1 suppository (25 mg total) rectally every 6 (six) hours as needed for nausea or vomiting.       Diet: routine diet  Activity as tolerated  Outpatient follow up: Follow-up Information    [facility]. Schedule an appointment as soon as possible for a visit in 1 week(s).   Specialty:  Obstetrics and Gynecology Why:  for follow up after hospitalization/hyperemesis Contact information: [address] [place] [address] [phone]            SIGNED:  Tresea Mall, CNM 09/12/2018 9:10 PM

## 2018-09-12 NOTE — Progress Notes (Signed)
Patient ate regular diet for dinner (baked potato, chicken, and angel food cake). Patient tolerated food well with no nausea or vomiting. RN to update provider. Patient to possibly be discharged.

## 2018-09-12 NOTE — Progress Notes (Addendum)
Daily Antepartum Note  Admission Date: 09/10/2018 Current Date: 09/12/2018 10:16 AM  Victoria Becker is a 22 y.o. G1P0000 @ [redacted]w[redacted]d by ultrasound admitted for hyperemesis  Pregnancy complicated by:  Patient Active Problem List   Diagnosis Date Noted  . Hyperemesis gravidarum 09/10/2018  . Nausea and vomiting during pregnancy 09/01/2018  . Supervision of high risk pregnancy, antepartum 08/17/2018    Overnight/24hr events:  IV fluids and other medications as ordered  Subjective:  She reports feeling much better today although still a little queasy. She has not yet taken PO nourishment but is now ordering something. She would like to go home today. Will re-evaluate later in the day for readiness to go home.  Objective:   Vitals:   09/12/18 0515 09/12/18 0806  BP: (!) 92/59 100/62  Pulse: 75 80  Resp: 20 18  Temp: 98.7 F (37.1 C) 99 F (37.2 C)  SpO2: 100% 99%   Temp:  [97.7 F (36.5 C)-99 F (37.2 C)] 99 F (37.2 C) (11/10 0806) Pulse Rate:  [67-80] 80 (11/10 0806) Resp:  [18-20] 18 (11/10 0806) BP: (92-102)/(54-66) 100/62 (11/10 0806) SpO2:  [97 %-100 %] 99 % (11/10 0806) Temp (24hrs), Avg:98.4 F (36.9 C), Min:97.7 F (36.5 C), Max:99 F (37.2 C)   Intake/Output Summary (Last 24 hours) at 09/12/2018 1016 Last data filed at 09/11/2018 1600 Gross per 24 hour  Intake -  Output 600 ml  Net -600 ml     Current Vital Signs 24h Vital Sign Ranges  T 99 F (37.2 C) Temp  Avg: 98.4 F (36.9 C)  Min: 97.7 F (36.5 C)  Max: 99 F (37.2 C)  BP 100/62 BP  Min: 92/59  Max: 102/66  HR 80 Pulse  Avg: 72.8  Min: 67  Max: 80  RR 18 Resp  Avg: 18.8  Min: 18  Max: 20  SaO2 99 % Room Air SpO2  Avg: 98.8 %  Min: 97 %  Max: 100 %       24 Hour I/O Current Shift I/O  Time Ins Outs 11/09 0701 - 11/10 0700 In: -  Out: 600 [Urine:600] No intake/output data recorded.   Patient Vitals for the past 24 hrs:  BP Temp Temp src Pulse Resp SpO2  09/12/18 0806 100/62 99 F (37.2 C)  Oral 80 18 99 %  09/12/18 0515 (!) 92/59 98.7 F (37.1 C) Oral 75 20 100 %  09/11/18 1918 (!) 93/59 98.6 F (37 C) Oral 67 18 100 %  09/11/18 1600 102/66 98 F (36.7 C) Oral 72 18 97 %  09/11/18 1212 (!) 93/54 97.7 F (36.5 C) Oral 70 20 98 %    Physical exam: General: Well nourished, well developed female in no acute distress. Abdomen: gravid  Cardiovascular: regular rate and rhythm Respiratory: CTAB Extremities: no clubbing, cyanosis or edema Skin: Warm and dry.   Medications: Current Facility-Administered Medications  Medication Dose Route Frequency Provider Last Rate Last Dose  . dextrose 5 %-0.45 % sodium chloride infusion   Intravenous Continuous Vena Austria, MD 125 mL/hr at 09/12/18 0851    . famotidine (PEPCID) IVPB 20 mg premix  20 mg Intravenous Q12H Farrel Conners, CNM 100 mL/hr at 09/12/18 0951 20 mg at 09/12/18 0951  . levothyroxine (SYNTHROID, LEVOTHROID) tablet 125 mcg  125 mcg Oral QAC breakfast Mauri Reading, [place]   125 mcg at 09/12/18 1610  . methylPREDNISolone (MEDROL DOSEPAK) tablet 16 mg  16 mg Oral Q breakfast Vena Austria, MD   [phone]  mg at 09/12/18 0852   Followed by  . [START ON 09/15/2018] methylPREDNISolone (MEDROL DOSEPAK) tablet 8 mg  8 mg Oral Q breakfast Vena Austria, MD       Followed by  . [START ON 09/22/2018] methylPREDNISolone (MEDROL DOSEPAK) tablet 4 mg  4 mg Oral Q breakfast Vena Austria, MD      . methylPREDNISolone (MEDROL DOSEPAK) tablet 16 mg  16 mg Oral Q1400 Vena Austria, MD   16 mg at 09/11/18 1412   Followed by  . [START ON 09/13/2018] methylPREDNISolone (MEDROL) tablet 8 mg  8 mg Oral Q1400 Vena Austria, MD       Followed by  . [START ON 09/16/2018] methylPREDNISolone (MEDROL) tablet 4 mg  4 mg Oral Q1400 Vena Austria, MD      . methylPREDNISolone (MEDROL) tablet 16 mg  16 mg Oral QHS Vena Austria, MD   16 mg at 09/11/18 2246   Followed by  . [START ON 09/14/2018] methylPREDNISolone  (MEDROL DOSEPAK) tablet 8 mg  8 mg Oral QHS Vena Austria, MD       Followed by  . [START ON 09/17/2018] methylPREDNISolone (MEDROL) tablet 4 mg  4 mg Oral QHS Vena Austria, MD      . metoCLOPramide (REGLAN) injection 10 mg  10 mg Intravenous Q6H PRN Vena Austria, MD      . multivitamins adult (MVI -12) 10 mL in dextrose 5% lactated ringers 1,000 mL infusion   Intravenous Daily Vena Austria, MD 125 mL/hr at 09/11/18 2252    . ondansetron (ZOFRAN) injection 4 mg  4 mg Intravenous Q6H PRN Vena Austria, MD   4 mg at 09/11/18 2034    Labs:  Recent Labs  Lab 09/05/18 1545  WBC 11.0*  HGB 14.7  HCT 43.6  PLT 361    Recent Labs  Lab 09/05/18 1545 09/10/18 1325 09/11/18 0623 09/12/18 0430  NA 137 140  --   --   K 4.3 4.2  --   --   CL 104 106  --   --   CO2 21* 21*  --   --   BUN 8 11  --   --   CREATININE 0.67 0.68  --   --   CALCIUM 9.9 9.9  --   --   PROT 8.2* 8.5*  --   --   BILITOT 1.2 1.3*  --   --   ALKPHOS 56 53  --   --   ALT 74* 51*  --   --   AST 40 27  --   --   GLUCOSE 79 91 126* 148*     Assessment & Plan:   22 yo G1 P0 female [redacted]w[redacted]d by ultrasound, EDD 04/13/2019 with hyperemesis  1. IV fluids D5 1/2 NS 2. Once daily MVI infusion 3. Zofran IV 4. Steroid taper 5. Synthroid 125 mcg daily before breakfast 6. Fetus- previable no monitoring 7. DVT ppx- ambulatory 8. Advance diet as tolerated 9. Disposition: pending clinical improvement   Tresea Mall, CNM [facility], [facility]

## 2018-09-12 NOTE — Progress Notes (Signed)
Patient vitals stable and WDL this shift. Patient states that she feels "pretty good" and has only complained of little nausea. Patient has not requested Zofran today. Has eaten chicken broth for breakfast with sips of water and diet coke and tolerated that with no nausea or vomiting. Patient now on regular diet and will order something bland for lunch. Patient complaining of gas pain. RN to call CNM for simethicone order. RN to continue to monitor patient.

## 2018-09-13 ENCOUNTER — Telehealth: Payer: Self-pay

## 2018-09-13 ENCOUNTER — Other Ambulatory Visit: Payer: Self-pay | Admitting: Obstetrics and Gynecology

## 2018-09-13 MED ORDER — METHYLPREDNISOLONE 4 MG PO TBPK: ORAL_TABLET | ORAL | 0 refills | Status: AC

## 2018-09-13 NOTE — Telephone Encounter (Signed)
Pharmacist is calling about the methylPREDNISolone, there are 3 of them, he wants to clarify if he should fill one and which one OR does he fill all (if they go in order). Also, that the dosepak one, the instructions and quantity do not match the normal dose pack they fill. Please advise.

## 2018-09-13 NOTE — Telephone Encounter (Signed)
Pt called triage saying she is confused about all these medications she is taking and wants to make sure she is taking the correct ones. She wants to know if she still needs to take methylprednisolone pills that were given to her at hospital: one pill at 2pm (8mg ) and the 4 at bedtime. Please advise. CB# [phone]

## 2018-09-13 NOTE — Telephone Encounter (Signed)
Yes the steroids should be a taper with decreasing dose each over the next week.  I don't know why Jane sent 3 prescriptions I had Kim fax in a new rx

## 2018-09-13 NOTE — Telephone Encounter (Signed)
Pt wants to know since the number of pills has gone down a lot with the new Rx sent in by Dr. Bonney Aid, wont that cause her vomiting to come back?

## 2018-09-13 NOTE — Progress Notes (Signed)
    Routine Prenatal Care Visit  Subjective  Dezi Brauner is a 22 y.o. G1P0000 at [redacted]w[redacted]d being seen today for ongoing prenatal care.  She is currently monitored for the following issues for this low-risk pregnancy and has Supervision of high risk pregnancy, antepartum; Nausea and vomiting during pregnancy; and Hyperemesis gravidarum on their problem list.  ----------------------------------------------------------------------------------- Patient reports nausea and emesis.   Contractions: Not present. Vag. Bleeding: None.  Movement: Absent. Denies leaking of fluid.  ----------------------------------------------------------------------------------- The following portions of the patient's history were reviewed and updated as appropriate: allergies, current medications, past family history, past medical history, past social history, past surgical history and problem list. Problem list updated.   Objective  Blood pressure 116/78, weight 154 lb (69.9 kg), last menstrual period 07/01/2018. Pregravid weight 164 lb (74.4 kg) Total Weight Gain -10 lb (-4.536 kg) Urinalysis:      Fetal Status:     Movement: Absent     General:  Alert, oriented and cooperative. Patient is in no acute distress.  Skin: Skin is warm and dry. No rash noted.   Cardiovascular: Normal heart rate noted  Respiratory: Normal respiratory effort, no problems with respiration noted  Abdomen: Soft, gravid, appropriate for gestational age. Pain/Pressure: Absent     Pelvic:  Cervical exam deferred        Extremities: Normal range of motion.     ental Status: Normal mood and affect. Normal behavior. Normal judgment and thought content.     Assessment   22 y.o. G1P0000 at [redacted]w[redacted]d by  04/13/2019, by Ultrasound presenting for work-in prenatal visit  Plan   FIRST Problems (from 08/17/18 to present)    Problem Noted Resolved   Supervision of high risk pregnancy, antepartum 08/17/2018 by Oswaldo Conroy, CNM No   Overview Signed  08/31/2018  3:27 PM by Oswaldo Conroy, CNM    Clinic [facility] Prenatal Labs  Dating  Blood type: O/Positive/-- (10/15 1622)   Genetic Screen 1 Screen:    AFP:     Quad:     NIPS: Antibody:Negative (10/15 1622)  Anatomic [place]  Rubella: 2.14 (10/15 1622) Varicella:    GTT Early:               Third trimester:  RPR: Non Reactive (10/15 1622)   Rhogam  HBsAg: Negative (10/15 1622)   TDaP vaccine                       Flu Shot: HIV: Non Reactive (10/15 1622)   Baby Food                                GBS:   Contraception  Pap:  CBB     CS/VBAC    Support Person                  Gestational age appropriate obstetric precautions including but not limited to vaginal bleeding, contractions, leaking of fluid and fetal movement were reviewed in detail with the patient.  - add reglan and promethazine suppositories - offered direct admission declined - follow up later this week to verify improvement    Return in about 3 days (around 09/09/2018) for 2-3 days ROB.  Vena Austria, MD, [facility], [facility]

## 2018-09-13 NOTE — Telephone Encounter (Signed)
Pts dad called to ask about Rx. Dr Bonney Aid gave me Rx to be faxed and I did. Pts father is aware of pharmacy it was sent to.

## 2018-09-14 ENCOUNTER — Other Ambulatory Visit: Payer: Self-pay | Admitting: Advanced Practice Midwife

## 2018-09-14 DIAGNOSIS — O219 Vomiting of pregnancy, unspecified: Secondary | ICD-10-CM

## 2018-09-14 MED ORDER — ONDANSETRON 4 MG PO TBDP: 4.0000 mg | ORAL_TABLET | Freq: Four times a day (QID) | ORAL | 1 refills | Status: DC | PRN

## 2018-09-14 NOTE — Telephone Encounter (Signed)
Spoke with patient regarding new Rx dosing of steroid taper.

## 2018-09-14 NOTE — Progress Notes (Signed)
Spoke with patient. Reassurance given regarding new plan for steroid taper per Dr Bonney Aid yesterday. Encouraged patient to eat small frequent meals. She has some anxiety regarding the new lower dose of steroids and is not eating because of worry and nausea.

## 2018-09-20 ENCOUNTER — Ambulatory Visit (INDEPENDENT_AMBULATORY_CARE_PROVIDER_SITE_OTHER): Payer: Managed Care, Other (non HMO) | Admitting: Advanced Practice Midwife

## 2018-09-20 ENCOUNTER — Encounter: Payer: Self-pay | Admitting: Advanced Practice Midwife

## 2018-09-20 VITALS — BP 118/66 | Wt 150.0 lb

## 2018-09-20 DIAGNOSIS — Z1379 Encounter for other screening for genetic and chromosomal anomalies: Secondary | ICD-10-CM

## 2018-09-20 DIAGNOSIS — O219 Vomiting of pregnancy, unspecified: Secondary | ICD-10-CM

## 2018-09-20 DIAGNOSIS — Z3A10 10 weeks gestation of pregnancy: Secondary | ICD-10-CM

## 2018-09-20 LAB — POCT URINALYSIS DIPSTICK OB: GLUCOSE, UA: NEGATIVE

## 2018-09-20 MED ORDER — ONDANSETRON 4 MG PO TBDP: 4.0000 mg | ORAL_TABLET | Freq: Four times a day (QID) | ORAL | 3 refills | Status: DC | PRN

## 2018-09-20 NOTE — Progress Notes (Signed)
ROB

## 2018-09-20 NOTE — Progress Notes (Signed)
  Routine Prenatal Care Visit  Subjective  Victoria Becker is a 22 y.o. G1P0000 at 6491w5d being seen today for ongoing prenatal care.  She is currently monitored for the following issues for this high-risk pregnancy and has Supervision of high risk pregnancy, antepartum; Nausea and vomiting during pregnancy; Hyperemesis gravidarum; Hypothyroidism due to Hashimoto's thyroiditis; and Nontoxic multinodular goiter on their problem list.  ----------------------------------------------------------------------------------- Patient reports feeling much better from a week ago. She has been taking her steroid taper and continues with zofran. She is now vomiting 3 or 4 times per day instead of 10 times. She is mainly drinking Ensure and some water.   Contractions: Not present. Vag. Bleeding: None.   . Denies leaking of fluid.  ----------------------------------------------------------------------------------- The following portions of the patient's history were reviewed and updated as appropriate: allergies, current medications, past family history, past medical history, past social history, past surgical history and problem list. Problem list updated.   Objective  Blood pressure 118/66, weight 150 lb (68 kg), last menstrual period 07/01/2018. Pregravid weight 164 lb (74.4 kg) Total Weight Gain -14 lb (-6.35 kg) Urinalysis: Urine Protein Trace  Urine Glucose Negative  Fetal Status: Fetal Heart Rate (bpm): 186         General:  Alert, oriented and cooperative. Patient is in no acute distress.  Skin: Skin is warm and dry. No rash noted.   Cardiovascular: Normal heart rate noted  Respiratory: Normal respiratory effort, no problems with respiration noted  Abdomen: Soft, gravid, appropriate for gestational age. Pain/Pressure: Absent     Pelvic:  Cervical exam deferred        Extremities: Normal range of motion.     Mental Status: Normal mood and affect. Normal behavior. Normal judgment and thought content.    Assessment   22 y.o. G1P0000 at 3291w5d by  04/13/2019, by Ultrasound presenting for routine prenatal visit  Plan   FIRST Problems (from 08/17/18 to present)    Problem Noted Resolved   Supervision of high risk pregnancy, antepartum 08/17/2018 by Oswaldo ConroySchmid, Jacelyn Y, CNM No   Overview Signed 08/31/2018  3:27 PM by Oswaldo ConroySchmid, Jacelyn Y, CNM    [facility]inic [facility] Prenatal Labs  Dating  Blood type: O/Positive/-- (10/15 1622)   Genetic Screen 1 Screen:    AFP:     Quad:     NIPS: Antibody:Negative (10/15 1622)  Anatomic US  Rubella: 2.14 (10/15 1622) Varicella:    GTT Early:               Third trimester:  RPR: Non Reactive (10/15 1622)   Rhogam  HBsAg: Negative (10/15 1622)   TDaP vaccine                       Flu Shot: HIV: Non Reactive (10/15 1622)   Baby Food                                GBS:   Contraception  Pap:  CBB     CS/VBAC    Support Person                  Preterm labor symptoms and general obstetric precautions including but not limited to vaginal bleeding, contractions, leaking of fluid and fetal movement were reviewed in detail with the patient.   Return in about 4 weeks (around 10/18/2018) for rob.  Tresea MallJane Glendine Swetz, CNM 09/20/2018 11:37 AM

## 2018-09-20 NOTE — Patient Instructions (Signed)

## 2018-09-22 ENCOUNTER — Telehealth: Payer: Self-pay

## 2018-09-22 NOTE — Telephone Encounter (Signed)
FMLA/DISABILITY form filled out for [employer], signature obtained, and given to [place] for processing.

## 2018-09-27 LAB — MATERNIT 21 PLUS CORE, BLOOD
CHROMOSOME 13: NEGATIVE
CHROMOSOME 18: NEGATIVE
Chromosome 21: NEGATIVE
Y CHROMOSOME: DETECTED

## 2018-09-29 ENCOUNTER — Encounter: Payer: Self-pay | Admitting: Advanced Practice Midwife

## 2018-09-29 ENCOUNTER — Ambulatory Visit (INDEPENDENT_AMBULATORY_CARE_PROVIDER_SITE_OTHER): Payer: Managed Care, Other (non HMO) | Admitting: Advanced Practice Midwife

## 2018-09-29 VITALS — BP 110/58 | Wt 151.0 lb

## 2018-09-29 DIAGNOSIS — Z3A12 12 weeks gestation of pregnancy: Secondary | ICD-10-CM

## 2018-09-29 NOTE — Progress Notes (Signed)
ROB Nausea and vomiting, breathing problems while laying down on her back and left side.  School paperwork review.

## 2018-09-29 NOTE — Progress Notes (Signed)
  Routine Prenatal Care Visit  Subjective  Victoria Becker is a 22 y.o. G1P0000 at 3747w0d being seen today for ongoing prenatal care.  She is currently monitored for the following issues for this high-risk pregnancy and has Supervision of high risk pregnancy, antepartum; Nausea and vomiting during pregnancy; Hyperemesis gravidarum; Hypothyroidism due to Hashimoto's thyroiditis; and Nontoxic multinodular goiter on their problem list.  ----------------------------------------------------------------------------------- Patient reports still vomiting 4-5 times per day but she is noticing some improvement in amount of nausea.   Contractions: Not present. Vag. Bleeding: None.  Movement: Absent. Denies leaking of fluid.  ----------------------------------------------------------------------------------- The following portions of the patient's history were reviewed and updated as appropriate: allergies, current medications, past family history, past medical history, past social history, past surgical history and problem list. Problem list updated.   Objective  Blood pressure (!) 110/58, weight 151 lb (68.5 kg), last menstrual period 07/01/2018. Pregravid weight 164 lb (74.4 kg) Total Weight Gain -13 lb (-5.897 kg) Urinalysis: Urine Protein    Urine Glucose    Fetal Status: Fetal Heart Rate (bpm): 181   Movement: Absent     General:  Alert, oriented and cooperative. Patient is in no acute distress.  Skin: Skin is warm and dry. No rash noted.   Cardiovascular: Normal heart rate noted  Respiratory: Normal respiratory effort, no problems with respiration noted  Abdomen: Soft, gravid, appropriate for gestational age. Pain/Pressure: Absent     Pelvic:  Cervical exam deferred        Extremities: Normal range of motion.     Mental Status: Normal mood and affect. Normal behavior. Normal judgment and thought content.   Assessment   22 y.o. G1P0000 at 5847w0d by  04/13/2019, by Ultrasound presenting for routine  prenatal visit  Plan   FIRST Problems (from 08/17/18 to present)    Problem Noted Resolved   Supervision of high risk pregnancy, antepartum 08/17/2018 by Oswaldo ConroySchmid, Jacelyn Y, CNM No   Overview Signed 08/31/2018  3:27 PM by Oswaldo ConroySchmid, Jacelyn Y, CNM    [facility]inic [facility] Prenatal Labs  Dating  Blood type: O/Positive/-- (10/15 1622)   Genetic Screen 1 Screen:    AFP:     Quad:     NIPS: Antibody:Negative (10/15 1622)  Anatomic US  Rubella: 2.14 (10/15 1622) Varicella:    GTT Early:               Third trimester:  RPR: Non Reactive (10/15 1622)   Rhogam  HBsAg: Negative (10/15 1622)   TDaP vaccine                       Flu Shot: HIV: Non Reactive (10/15 1622)   Baby Food                                GBS:   Contraception  Pap:  CBB     CS/VBAC    Support Person                  Preterm labor symptoms and general obstetric precautions including but not limited to vaginal bleeding, contractions, leaking of fluid and fetal movement were reviewed in detail with the patient.    Return in about 4 weeks (around 10/27/2018) for rob.  Tresea MallJane Toshua Honsinger, CNM 09/29/2018 9:40 AM

## 2018-10-28 ENCOUNTER — Other Ambulatory Visit (HOSPITAL_COMMUNITY)
Admission: RE | Admit: 2018-10-28 | Discharge: 2018-10-28 | Disposition: A | Discharge status: Home / Self Care | Payer: Managed Care, Other (non HMO) | Source: Ambulatory Visit | Attending: Certified Nurse Midwife | Admitting: Certified Nurse Midwife

## 2018-10-28 ENCOUNTER — Ambulatory Visit (INDEPENDENT_AMBULATORY_CARE_PROVIDER_SITE_OTHER): Payer: Managed Care, Other (non HMO) | Admitting: Certified Nurse Midwife

## 2018-10-28 VITALS — BP 100/86 | Wt 152.0 lb

## 2018-10-28 DIAGNOSIS — R8290 Unspecified abnormal findings in urine: Secondary | ICD-10-CM

## 2018-10-28 DIAGNOSIS — Z124 Encounter for screening for malignant neoplasm of cervix: Secondary | ICD-10-CM | POA: Diagnosis present

## 2018-10-28 DIAGNOSIS — Z3A16 16 weeks gestation of pregnancy: Secondary | ICD-10-CM

## 2018-10-28 DIAGNOSIS — Z131 Encounter for screening for diabetes mellitus: Secondary | ICD-10-CM

## 2018-10-28 DIAGNOSIS — Z369 Encounter for antenatal screening, unspecified: Secondary | ICD-10-CM

## 2018-10-28 DIAGNOSIS — O9989 Other specified diseases and conditions complicating pregnancy, childbirth and the puerperium: Secondary | ICD-10-CM

## 2018-10-28 DIAGNOSIS — Z363 Encounter for antenatal screening for malformations: Secondary | ICD-10-CM

## 2018-10-28 LAB — POCT URINALYSIS DIPSTICK
BILIRUBIN UA: NEGATIVE
Glucose, UA: NEGATIVE
KETONES UA: NEGATIVE
Leukocytes, UA: NEGATIVE
Nitrite, UA: NEGATIVE
PH UA: 5 (ref 5.0–8.0)
Protein, UA: POSITIVE — AB
RBC UA: NEGATIVE
Spec Grav, UA: >=1.03 — AB (ref 1.010–1.025)
UROBILINOGEN UA: NEGATIVE U/dL — AB

## 2018-10-28 LAB — POCT URINALYSIS DIPSTICK OB: GLUCOSE, UA: NEGATIVE

## 2018-10-28 NOTE — Progress Notes (Signed)
C/o pink tint to urine and odor, yellow d/c.

## 2018-10-28 NOTE — Progress Notes (Signed)
States she needs pap done.rj

## 2018-10-29 LAB — AFP, SERUM, OPEN SPINA BIFIDA
AFP MoM: 1.89
AFP VALUE AFPOSL: 62.7 ng/mL
Gest. Age on Collection Date: 16.1 weeks
MATERNAL AGE AT EDD: 24 a
OSBR Risk 1 IN: 1034
TEST RESULTS AFP: NEGATIVE
Weight: 152 [lb_av]

## 2018-11-02 LAB — CYTOLOGY - PAP
CHLAMYDIA, DNA PROBE: NEGATIVE
Diagnosis: NEGATIVE
NEISSERIA GONORRHEA: NEGATIVE

## 2018-11-04 NOTE — Progress Notes (Signed)
HROB at 16wk1d: Having some right SI joint pain, otherwise doing well. Discussed comfort measures. May want to see a chiropractor. Continues on Metformin 500 mgm XR. Seen by endocrinologist 2 weeks ago. Her Synthroid was increased to taking 2 tabs on Sunday.Materniti 21 was diploid XY. Desires MSAFP today. Urine looks concentrated and there is +1 protein. No dysuria. Pap done today. FHT WNL. Advised to increase water intake.  RTO in 3 weeks for 1 hour GTT and anatomy scan Stop Metformin 1 week before 1 hour GTT  Farrel Conners, CNM

## 2018-11-16 ENCOUNTER — Ambulatory Visit: Payer: Managed Care, Other (non HMO)

## 2018-11-16 ENCOUNTER — Encounter: Payer: Managed Care, Other (non HMO) | Admitting: Advanced Practice Midwife

## 2018-11-16 ENCOUNTER — Other Ambulatory Visit: Payer: Managed Care, Other (non HMO)

## 2018-11-24 ENCOUNTER — Encounter: Payer: Self-pay | Admitting: Maternal Newborn

## 2018-11-24 ENCOUNTER — Ambulatory Visit (INDEPENDENT_AMBULATORY_CARE_PROVIDER_SITE_OTHER): Payer: Managed Care, Other (non HMO) | Admitting: Maternal Newborn

## 2018-11-24 ENCOUNTER — Ambulatory Visit (INDEPENDENT_AMBULATORY_CARE_PROVIDER_SITE_OTHER): Payer: Managed Care, Other (non HMO)

## 2018-11-24 ENCOUNTER — Other Ambulatory Visit: Payer: Managed Care, Other (non HMO)

## 2018-11-24 VITALS — BP 104/68 | Wt 156.0 lb

## 2018-11-24 DIAGNOSIS — O0990 Supervision of high risk pregnancy, unspecified, unspecified trimester: Secondary | ICD-10-CM

## 2018-11-24 DIAGNOSIS — Z3A20 20 weeks gestation of pregnancy: Secondary | ICD-10-CM

## 2018-11-24 DIAGNOSIS — Z363 Encounter for antenatal screening for malformations: Secondary | ICD-10-CM | POA: Diagnosis not present

## 2018-11-24 DIAGNOSIS — O0992 Supervision of high risk pregnancy, unspecified, second trimester: Secondary | ICD-10-CM

## 2018-11-24 DIAGNOSIS — Z131 Encounter for screening for diabetes mellitus: Secondary | ICD-10-CM

## 2018-11-24 LAB — POCT URINALYSIS DIPSTICK OB
Glucose, UA: NEGATIVE
PROTEIN: NEGATIVE

## 2018-11-24 NOTE — Progress Notes (Signed)
    Routine Prenatal Care Visit  Subjective  Cathaleen Freye is a 23 y.o. G1P0000 at [redacted]w[redacted]d being seen today for ongoing prenatal care.  She is currently monitored for the following issues for this high-risk pregnancy and has Supervision of high risk pregnancy, antepartum; Nausea and vomiting during pregnancy; Hyperemesis gravidarum; Hypothyroidism due to Hashimoto's thyroiditis; and Nontoxic multinodular goiter on their problem list.  ----------------------------------------------------------------------------------- Patient reports nausea and vomiting but much improved from previously and is able to eat meals. Was not able to tolerate glucola today.   Contractions: Not present. Vag. Bleeding: None.  Movement: Present. No leaking of fluid.  ----------------------------------------------------------------------------------- The following portions of the patient's history were reviewed and updated as appropriate: allergies, current medications, past family history, past medical history, past social history, past surgical history and problem list. Problem list updated.  Objective  Blood pressure 104/68, weight 156 lb (70.8 kg), last menstrual period 07/01/2018. Pregravid weight 164 lb (74.4 kg) Total Weight Gain -8 lb (-3.629 kg) Body mass index is 33.76 kg/m.   Urinalysis: Urine dipstick shows negative for glucose, protein. Fetal Status: Fetal Heart Rate (bpm): 140   Movement: Present     General:  Alert, oriented and cooperative. Patient is in no acute distress.  Skin: Skin is warm and dry. No rash noted.   Cardiovascular: Normal heart rate noted  Respiratory: Normal respiratory effort, no problems with respiration noted  Abdomen: Soft, gravid, appropriate for gestational age. Pain/Pressure: Absent     Pelvic:  Cervical exam deferred        Extremities: Normal range of motion.     Mental Status: Normal mood and affect. Normal behavior. Normal judgment and thought content.    Assessment    22 y.o. G1P0000 at [redacted]w[redacted]d, EDD 04/13/2019 by Ultrasound presenting for a routine prenatal visit.  Plan   FIRST Problems (from 08/17/18 to present)    Problem Noted Resolved   Supervision of high risk pregnancy, antepartum 08/17/2018 by Oswaldo Conroy, CNM No   Overview Addendum 11/09/2018 10:41 AM by Farrel Conners, CNM    [facility]inic [facility] Prenatal Labs  Dating  Blood type: O/Positive/-- (10/15 1622)   Genetic Screen 1 Screen:    AFP:  neg   Quad:     NIPS: Antibody:Negative (10/15 1622)  Anatomic [place]  Rubella: 2.14 (10/15 1622) Varicella:    GTT Early:               Third trimester:  RPR: Non Reactive (10/15 1622)   Rhogam  HBsAg: Negative (10/15 1622)   TDaP vaccine                       Flu Shot: HIV: Non Reactive (10/15 1622)   Baby Food                                GBS:   Contraception  Pap: NIL/ negative GC and Chlamydia  CBB     CS/VBAC    Support Person               Normal anatomy scan completed today.  Will return for lab visit to complete GTT.  Return in about 4 weeks (around 12/22/2018) for ROB.  Marcelyn Bruins, CNM 11/24/2018  10:38 AM

## 2018-11-24 NOTE — Progress Notes (Signed)
Pt was unable to keep down glucola. Will r/s. No pregnancy complaints. Unable to produce specimen at this time.

## 2018-11-24 NOTE — Patient Instructions (Signed)

## 2018-11-30 ENCOUNTER — Other Ambulatory Visit: Payer: Managed Care, Other (non HMO)

## 2018-12-01 LAB — GLUCOSE, 1 HOUR GESTATIONAL: Gestational Diabetes Screen: 118 mg/dL (ref 65–139)

## 2018-12-22 ENCOUNTER — Ambulatory Visit (INDEPENDENT_AMBULATORY_CARE_PROVIDER_SITE_OTHER): Payer: Managed Care, Other (non HMO) | Admitting: Certified Nurse Midwife

## 2018-12-22 VITALS — BP 110/60 | Wt 159.0 lb

## 2018-12-22 DIAGNOSIS — O0990 Supervision of high risk pregnancy, unspecified, unspecified trimester: Secondary | ICD-10-CM

## 2018-12-22 DIAGNOSIS — E038 Other specified hypothyroidism: Secondary | ICD-10-CM

## 2018-12-22 DIAGNOSIS — Z131 Encounter for screening for diabetes mellitus: Secondary | ICD-10-CM

## 2018-12-22 DIAGNOSIS — O26892 Other specified pregnancy related conditions, second trimester: Secondary | ICD-10-CM

## 2018-12-22 DIAGNOSIS — B3731 Acute candidiasis of vulva and vagina: Secondary | ICD-10-CM

## 2018-12-22 DIAGNOSIS — O26899 Other specified pregnancy related conditions, unspecified trimester: Secondary | ICD-10-CM

## 2018-12-22 DIAGNOSIS — R109 Unspecified abdominal pain: Secondary | ICD-10-CM

## 2018-12-22 DIAGNOSIS — O26852 Spotting complicating pregnancy, second trimester: Secondary | ICD-10-CM

## 2018-12-22 DIAGNOSIS — E282 Polycystic ovarian syndrome: Secondary | ICD-10-CM

## 2018-12-22 DIAGNOSIS — E063 Autoimmune thyroiditis: Secondary | ICD-10-CM

## 2018-12-22 DIAGNOSIS — Z3A24 24 weeks gestation of pregnancy: Secondary | ICD-10-CM

## 2018-12-22 DIAGNOSIS — Z113 Encounter for screening for infections with a predominantly sexual mode of transmission: Secondary | ICD-10-CM

## 2018-12-22 DIAGNOSIS — B373 Candidiasis of vulva and vagina: Secondary | ICD-10-CM

## 2018-12-22 LAB — POCT URINALYSIS DIPSTICK OB: GLUCOSE, UA: NEGATIVE

## 2018-12-22 MED ORDER — TERCONAZOLE 80 MG VA SUPP: 80.0000 mg | Freq: Every day | VAGINAL | 0 refills | Status: DC

## 2018-12-22 MED ORDER — LEVOTHYROXINE SODIUM 125 MCG PO TABS: ORAL_TABLET | ORAL | 0 refills | Status: DC

## 2018-12-22 NOTE — Progress Notes (Signed)
ROB C/o some spotting a couple weeks ago, but went away after a few hours, some braxton hicks, back and hip pain  Flu shot in September  Needs refill on Synthroid

## 2018-12-24 LAB — URINE CULTURE

## 2018-12-28 DIAGNOSIS — E282 Polycystic ovarian syndrome: Secondary | ICD-10-CM | POA: Insufficient documentation

## 2018-12-28 NOTE — Progress Notes (Signed)
ROB at 24 weeks: Had some spotting 2-3 weeks ago that was not accompanied by cramping and did not follow intercourse. No vulvar itching or irritation. Has been having some BH contractions. Wet prep today was positive for hyphae. Exam was negative for bleeding. Cervix long/thick/closed. Urine culture sent RX for Terazol suppositories x3.  Hx of PCOS-takes Metformin. Last glucola 118 after stopping metformin x 1 week. Need to repeat in 4 weeks Hypothyroidism: needs refill of thyroid until she sees endocrine. RX sent to pharmacy for levothyroxine Monday-Sat and 250 mcg on Sunday. ROB and 28 week labs in 4 weeks (stop Metformin 1 week before) Farrel Conners, CNM

## 2019-01-19 ENCOUNTER — Ambulatory Visit (INDEPENDENT_AMBULATORY_CARE_PROVIDER_SITE_OTHER): Payer: Managed Care, Other (non HMO) | Admitting: Certified Nurse Midwife

## 2019-01-19 ENCOUNTER — Other Ambulatory Visit: Payer: Managed Care, Other (non HMO)

## 2019-01-19 ENCOUNTER — Other Ambulatory Visit: Payer: Self-pay

## 2019-01-19 VITALS — BP 110/80 | Wt 164.0 lb

## 2019-01-19 DIAGNOSIS — O0993 Supervision of high risk pregnancy, unspecified, third trimester: Secondary | ICD-10-CM

## 2019-01-19 DIAGNOSIS — O0990 Supervision of high risk pregnancy, unspecified, unspecified trimester: Secondary | ICD-10-CM

## 2019-01-19 DIAGNOSIS — Z113 Encounter for screening for infections with a predominantly sexual mode of transmission: Secondary | ICD-10-CM

## 2019-01-19 DIAGNOSIS — Z131 Encounter for screening for diabetes mellitus: Secondary | ICD-10-CM

## 2019-01-19 DIAGNOSIS — Z3A28 28 weeks gestation of pregnancy: Secondary | ICD-10-CM

## 2019-01-19 DIAGNOSIS — E282 Polycystic ovarian syndrome: Secondary | ICD-10-CM

## 2019-01-19 NOTE — Progress Notes (Signed)
ROB/1 Hr GTT C/o braxton hicks frequently when she does fast movements

## 2019-01-20 LAB — 28 WEEK RH+PANEL
BASOS: 0 %
Basophils Absolute: 0 10*3/uL (ref 0.0–0.2)
EOS (ABSOLUTE): 0.1 10*3/uL (ref 0.0–0.4)
EOS: 1 %
GESTATIONAL DIABETES SCREEN: 197 mg/dL — AB (ref 65–139)
HEMATOCRIT: 35.5 % (ref 34.0–46.6)
HIV SCREEN 4TH GENERATION: NONREACTIVE
Hemoglobin: 12.4 g/dL (ref 11.1–15.9)
Immature Grans (Abs): 0.1 10*3/uL (ref 0.0–0.1)
Immature Granulocytes: 1 %
LYMPHS ABS: 1.7 10*3/uL (ref 0.7–3.1)
Lymphs: 18 %
MCH: 31.8 pg (ref 26.6–33.0)
MCHC: 34.9 g/dL (ref 31.5–35.7)
MCV: 91 fL (ref 79–97)
MONOCYTES: 5 %
Monocytes Absolute: 0.5 10*3/uL (ref 0.1–0.9)
NEUTROS ABS: 7 10*3/uL (ref 1.4–7.0)
Neutrophils: 75 %
PLATELETS: 253 10*3/uL (ref 150–450)
RBC: 3.9 x10E6/uL (ref 3.77–5.28)
RDW: 11.7 % (ref 11.7–15.4)
RPR: NONREACTIVE
WBC: 9.4 10*3/uL (ref 3.4–10.8)

## 2019-01-21 ENCOUNTER — Telehealth: Payer: Self-pay | Admitting: Certified Nurse Midwife

## 2019-01-21 DIAGNOSIS — R7309 Other abnormal glucose: Secondary | ICD-10-CM

## 2019-01-21 NOTE — Telephone Encounter (Signed)
Patient called and advised of lab results. One hour GTT was 197. Explained that 3 hour GTT is recommended. Will have front staff call to schedule 3 hour GTT Victoria Becker, CNM

## 2019-01-23 NOTE — Progress Notes (Signed)
ROB at 28 weeks and 1 hour GTT: Good FM. 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 . Starts to feel poorly after 8 hours at work. More contractions the longer she has been up on feet. Note for work to limit hours at work and to avoid patient's with flu like symptoms. Has been off her Metformin for 1 week prior to getting 1 hour GTT today Prenatal class schedule given to patient. ROB in 2 weeks.  Farrel Conners, CNM

## 2019-02-02 ENCOUNTER — Encounter: Payer: Managed Care, Other (non HMO) | Admitting: Maternal Newborn

## 2019-02-03 ENCOUNTER — Encounter: Payer: Self-pay | Admitting: Maternal Newborn

## 2019-02-03 ENCOUNTER — Other Ambulatory Visit: Payer: Self-pay

## 2019-02-03 ENCOUNTER — Other Ambulatory Visit: Payer: Managed Care, Other (non HMO)

## 2019-02-03 ENCOUNTER — Ambulatory Visit (INDEPENDENT_AMBULATORY_CARE_PROVIDER_SITE_OTHER): Payer: Managed Care, Other (non HMO) | Admitting: Maternal Newborn

## 2019-02-03 VITALS — BP 110/60 | Wt 166.0 lb

## 2019-02-03 DIAGNOSIS — Z23 Encounter for immunization: Secondary | ICD-10-CM | POA: Diagnosis not present

## 2019-02-03 DIAGNOSIS — O0990 Supervision of high risk pregnancy, unspecified, unspecified trimester: Secondary | ICD-10-CM

## 2019-02-03 DIAGNOSIS — R7309 Other abnormal glucose: Secondary | ICD-10-CM

## 2019-02-03 DIAGNOSIS — Z3A30 30 weeks gestation of pregnancy: Secondary | ICD-10-CM

## 2019-02-03 DIAGNOSIS — O0993 Supervision of high risk pregnancy, unspecified, third trimester: Secondary | ICD-10-CM

## 2019-02-03 LAB — POCT URINALYSIS DIPSTICK OB
Glucose, UA: NEGATIVE
POC,PROTEIN,UA: NEGATIVE

## 2019-02-03 NOTE — Patient Instructions (Signed)
   Given the current COVID-19 pandemic, our practice is making changes in how we are providing care to our patients. We are limiting in-person visits for the safety of all of our patients.   As a practice, we have met to discuss the best way to minimize visits, but still provide excellent care to our expecting mothers.  We have decided on the following visit structure for low-risk pregnancies.  Initial Pregnancy visit will be conducted as a telephone or web visit.  Between 10-14 weeks  there will be one in-person visit for an ultrasound, lab work, and genetic screening. 20 weeks in-person visit with an anatomy ultrasound  28 weeks in-person office visit for a 1-hour glucose test and a TDAP vaccination 32 weeks in-person office visit 34 weeks telephone visit 36 weeks in-person office visit for GBS, chlamydia, and gonorrhea testing 38 weeks in-person office visit 40 weeks in-person office visit  Understandably, some patients will require more visits than what is outlined above. Additional visits will be determined on a case-by-case basis.   We will, as always, be available for emergencies or to address concerns that might arise between in-person visits. We ask that you allow us the opportunity to address any concerns over the phone or through a virtual visit first. We will be available to return your phone calls throughout the day.   If you are able to purchase a scale, a blood pressure machine, and a home fetal doppler visits could be limited further. This will help decrease your exposure risks, but these purchases are not a necessity.   Things seem to change daily and there is the possibility that this structure could change, please be patient as we adapt to a new way of caring for patients.   Thank you for trusting us with your prenatal care. Our practice values you and looks forward to providing you with excellent care.   Sincerely,   [facility], [facility]     COVID-19 and Your Pregnancy FAQ  How can I prevent infection with COVID-19 during my pregnancy? Social distancing is key. Please limit any interactions in public. Try and work from home if possible. Frequently wash your hands after touching possibly contaminated surfaces. Avoid touching your face.  Minimize trips to the store. Consider online ordering when possible.   Should I wear a mask? Masks should only be worn by those experiencing symptoms of COVID-19 or those with confirmed COVID-19 when they are in public or around other individuals.  What are the symptoms of COVID-19? Fever (greater than 100.4 F), dry cough, shortness of breath.  Am I more at risk for COVID-19 since I am pregnant? There is not currently data showing that pregnant women are more adversely impacted by COVID-19 than the general population. However, we know that pregnant women tend to have worse respiratory complications from similar diseases such as the flu and SARS and for this reason should be considered an at-risk population.  What do I do if I am experiencing the symptoms of COVID-19? Testing is being limited because of test availability. If you are experiencing symptoms you should quarantine yourself, and the members of your family, for at least 2 weeks at home.   Please visit this website for more information: https://www.cdc.gov/coronavirus/2019-ncov/if-you-are-sick/steps-when-sick.html  When should I go to the Emergency Room? Please go to the emergency room if you are experiencing ANY of these symptoms*:  1.    Difficulty breathing or shortness of breath 2.    Persistent   Persistent pain or pressure in the chest 3.    Confusion or difficulty being aroused (or awakened) 4.    Bluish lips or face  *This list is not all inclusive. Please consult our office for any other symptoms that are severe or concerning.  What do I do if I am having difficulty breathing? You should go to the Emergency Room for  evaluation. At this time they have a tent set up for evaluating patients with COVID-19 symptoms.   How will my prenatal care be different because of the COVID-19 pandemic? It has been recommended to reduce the frequency of face-to-face visits and use resources such as telephone and virtual visits when possible. Using a scale, blood pressure machine and fetal doppler at home can further help reduce face-to-face visits. You will be provided with additional information on this topic.  We ask that you come to your visits alone to minimize potential exposures to  COVID-19.  How can I receive childbirth education? At this time in-person classes have been cancelled. You can register for online childbirth education, breastfeeding, and newborn care classes.  Please visit:  [email or web address] for more information  How will my hospital birth experience be different? The hospital is currently limiting visitors. This means that while you are in labor you can only have one person at the hospital with you. Additional family members will not be allowed to wait in the building or outside your room. Your one support person can be the father of the baby, a relative, a doula, or a friend. Once one support person is designated that person will wear a band. This band cannot be shared with multiple people.  How long will I stay in the hospital for after giving birth? It is also recommended that discharge home be expedited during the COVID-19 outbreak. This means staying for 1 day after a vaginal delivery and 2 days after a cesarean section.  What if I have COVID-19 and I am in labor? We ask that you wear a mask while on labor and delivery. We will try and accommodate you being placed in a room that is capable of filtering the air. Please call ahead if you are in labor and on your way to the hospital. The phone number for labor and delivery at [facility] is [phone].  If I have  COVID-19 when my baby is born how can I prevent my baby from contracting COVID-19? This is an issue that will have to be discussed on a case-by-case basis. Current recommendations suggest providing separate isolation rooms for both the mother and new infant as well as limiting visitors. However, there are practical challenges to this recommendation. The situation will assuredly change and decisions will be influenced by the desires of the mother and availability of space.  Some suggestions are the use of a curtain or physical barrier between mom and infant, hand hygiene, mom wearing a mask, or 6 feet of spacing between a mom and infant.   Can I breastfeed during the COVID-19 pandemic?   Yes, breastfeeding is encouraged.  Can I breastfeed if I have COVID-19? Yes. Covid-19 has not been found in breast milk. This means you cannot give COVID-19 to your child through breast milk. Breast feeding will also help pass antibodies to fight infection to your baby.   What precautions should I take when breastfeeding if I have COVID-19? If a mother and newborn do room-in and the mother wishes to feed at the breast, she should   put on a facemask and practice hand hygiene before each feeding.  What precautions should I take when pumping if I have COVID-19? Prior to expressing breast milk, mothers should practice hand hygiene. After each pumping session, all parts that come into contact with breast milk should be thoroughly washed and the entire pump should be appropriately disinfected per the manufacturer's instructions. This expressed breast milk should be fed to the newborn by a healthy caregiver.  What if I am pregnant and work in healthcare? Based on limited data regarding COVID-19 and pregnancy, ACOG currently does not propose creating additional restrictions on pregnant health care personnel because of COVID-19 alone. Pregnant women do not appear to be at higher risk of severe disease related to COVID-19.  Pregnant health care personnel should follow CDC risk assessment and infection control guidelines for health care personnel exposed to patients with suspected or confirmed COVID-19. Adherence to recommended infection prevention and control practices is an important part of protecting all health care personnel in health care settings.    Information on COVID-19 in pregnancy is very limited; however, facilities may want to consider limiting exposure of pregnant health care personnel to patients with confirmed or suspected COVID-19 infection, especially during higher-risk procedures (eg, aerosol-generating procedures), if feasible, based on staffing availability.    

## 2019-02-03 NOTE — Progress Notes (Signed)
Routine Prenatal Care Visit  Subjective  Victoria Becker is a 23 y.o. G1P0000 at [redacted]w[redacted]d being seen today for ongoing prenatal care.  She is currently monitored for the following issues for this high-risk pregnancy and has Supervision of high risk pregnancy, antepartum; Nausea and vomiting during pregnancy; Hyperemesis gravidarum; Hypothyroidism due to Hashimoto's thyroiditis; Nontoxic multinodular goiter; and PCOS (polycystic ovarian syndrome) on their problem list.  ----------------------------------------------------------------------------------- Patient reports Braxton-Hicks contractions. 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   Contractions: Not present. Vag. Bleeding: None.  Movement: Present. No leaking of fluid.  ----------------------------------------------------------------------------------- The following portions of the patient's history were reviewed and updated as appropriate: allergies, current medications, past family history, past medical history, past social history, past surgical history and problem list. Problem list updated.  Objective  Blood pressure 110/60, weight 166 lb (75.3 kg), last menstrual period 07/01/2018. Pregravid weight 164 lb (74.4 kg) Total Weight Gain 2 lb (0.907 kg) Body mass index is 35.92 kg/m.   Urinalysis: Urine dipstick shows negative for glucose, protein.  Fetal Status: Fetal Heart Rate (bpm): 132 Fundal Height: 30 cm Movement: Present     General:  Alert, oriented and cooperative. Patient is in no acute distress.  Skin: Skin is warm and dry. No rash noted.   Cardiovascular: Normal heart rate noted  Respiratory: Normal respiratory effort, no problems with respiration noted  Abdomen: Soft, gravid, appropriate for gestational age. Pain/Pressure: Present     Pelvic:  Cervical exam deferred        Extremities: Normal range of motion.     Mental Status: Normal mood and affect. Normal behavior. Normal  judgment and thought content.     Assessment   23 y.o. G1P0000 at [redacted]w[redacted]d, EDD 04/13/2019 by Ultrasound presenting for a routine prenatal visit.  Plan   FIRST Problems (from 08/17/18 to present)    Problem Noted Resolved   Supervision of high risk pregnancy, antepartum 08/17/2018 by Oswaldo Conroy, CNM No   Overview Addendum 11/09/2018 10:41 AM by Farrel Conners, CNM    Clinic [facility] Prenatal Labs  Dating  Blood type: O/Positive/-- (10/15 1622)   Genetic Screen 1 Screen:    AFP:  neg   Quad:     NIPS: Antibody:Negative (10/15 1622)  Anatomic [place]  Rubella: 2.14 (10/15 1622) Varicella:    GTT Early:               Third trimester:  RPR: Non Reactive (10/15 1622)   Rhogam  HBsAg: Negative (10/15 1622)   TDaP vaccine                       Flu Shot: HIV: Non Reactive (10/15 1622)   Baby Food                                GBS:   Contraception  Pap: NIL/ negative GC and Chlamydia  CBB     CS/VBAC    Support Person               3 hour GTT today.  Discussed spacing of visits and changes in prenatal care and intrapartum care due to COVID-19 precautions. Encouraged her to stay in touch via MyChart/telephone for any concerns or questions.   Preterm labor symptoms and general obstetric precautions including but not limited to vaginal bleeding, contractions, leaking of fluid and fetal movement were reviewed.  Please refer to After Visit Summary for other counseling recommendations.   Return in about 2 weeks (around 02/17/2019) for ROB.  Marcelyn Bruins, CNM 02/03/2019

## 2019-02-04 LAB — GESTATIONAL GLUCOSE TOLERANCE
Glucose, Fasting: 80 mg/dL (ref 65–94)
Glucose, GTT - 1 Hour: 202 mg/dL — ABNORMAL HIGH (ref 65–179)
Glucose, GTT - 2 Hour: 134 mg/dL (ref 65–154)
Glucose, GTT - 3 Hour: 124 mg/dL (ref 65–139)

## 2019-02-11 ENCOUNTER — Other Ambulatory Visit: Payer: Self-pay

## 2019-02-11 ENCOUNTER — Observation Stay
Admission: EM | Admit: 2019-02-11 | Discharge: 2019-02-11 | Disposition: A | Discharge status: Home / Self Care | Payer: Managed Care, Other (non HMO) | Attending: Obstetrics and Gynecology | Admitting: Obstetrics and Gynecology

## 2019-02-11 ENCOUNTER — Encounter: Payer: Self-pay | Admitting: *Deleted

## 2019-02-11 DIAGNOSIS — Z880 Allergy status to penicillin: Secondary | ICD-10-CM | POA: Diagnosis not present

## 2019-02-11 DIAGNOSIS — O4700 False labor before 37 completed weeks of gestation, unspecified trimester: Secondary | ICD-10-CM | POA: Diagnosis present

## 2019-02-11 DIAGNOSIS — O479 False labor, unspecified: Secondary | ICD-10-CM | POA: Diagnosis present

## 2019-02-11 DIAGNOSIS — Z3A31 31 weeks gestation of pregnancy: Secondary | ICD-10-CM | POA: Diagnosis not present

## 2019-02-11 DIAGNOSIS — O0990 Supervision of high risk pregnancy, unspecified, unspecified trimester: Secondary | ICD-10-CM

## 2019-02-11 DIAGNOSIS — O4703 False labor before 37 completed weeks of gestation, third trimester: Secondary | ICD-10-CM | POA: Diagnosis not present

## 2019-02-11 DIAGNOSIS — Z885 Allergy status to narcotic agent status: Secondary | ICD-10-CM | POA: Insufficient documentation

## 2019-02-11 DIAGNOSIS — Z91040 Latex allergy status: Secondary | ICD-10-CM | POA: Insufficient documentation

## 2019-02-11 LAB — URINALYSIS, COMPLETE (UACMP) WITH MICROSCOPIC
Bilirubin Urine: NEGATIVE
Glucose, UA: 50 mg/dL — AB
Hgb urine dipstick: NEGATIVE
Ketones, ur: NEGATIVE mg/dL
Nitrite: NEGATIVE
Protein, ur: NEGATIVE mg/dL
Specific Gravity, Urine: 1.015 (ref 1.005–1.030)
pH: 6 (ref 5.0–8.0)

## 2019-02-11 LAB — URINALYSIS, MICROSCOPIC (REFLEX)

## 2019-02-11 MED ORDER — NITROFURANTOIN MONOHYD MACRO 100 MG PO CAPS: 100.0000 mg | ORAL_CAPSULE | Freq: Two times a day (BID) | ORAL | 0 refills | Status: AC

## 2019-02-11 NOTE — OB Triage Note (Signed)
Discharge home, ambulatory. Victoria Becker  

## 2019-02-11 NOTE — Discharge Summary (Signed)
Physician Final Progress Note  Patient ID: Victoria CageLauren Counsell MRN: [id number] DOB/AGE: 24/05/1996 24 y.o.  Admit date: 02/11/2019 Admitting provider: Vena AustriaAndreas German Manke, MD Discharge date: 02/11/2019   Admission Diagnoses: Preterm contractions  Discharge Diagnoses:  Active Problems:   Preterm contractions  23 y.o. G1P0000 at 6928w2d presenting with lower abdominal cramping and sharp pain, non-positional, as well as lumbar back pain that started this morning at around 5:00AM and has been presistant since getting of her shift at work.  No dysuria, no thoracic back pain, no fevers, no chills.  +FM, no LOF, no VB.  On presentation the contractions were noted on tocometer, cervix was closed.  UA consistent with UTI with bacteria on microscopy.  Patient discharged with 7 day course of macrobid, urine culture sent.    FIRST Problems (from 08/17/18 to present)    Problem Noted Resolved   Supervision of high risk pregnancy, antepartum 08/17/2018 by Oswaldo ConroySchmid, Jacelyn Y, CNM No   Overview Addendum 02/04/2019 10:10 AM by Farrel ConnersGutierrez, Colleen, CNM    [facility]inic [facility] Prenatal Labs  Dating Ultrasound at 7w Blood type: O/Positive/-- (10/15 1622)   Genetic Screen NIPS: Negative XY Antibody:Negative (10/15 1622)  Anatomic US Complete and normal 1/22 Rubella: 2.14 (10/15 1622) Varicella: Immune  GTT Early: 118             Third trimester: 197    3 hour: 80/202/134/124 RPR: Non Reactive (10/15 1622)   Rhogam N/A HBsAg: Negative (10/15 1622)   TDaP vaccine 02/03/2019                 Flu Shot: HIV: Non Reactive (10/15 1622)   Baby Food                                GBS:   Contraception  Pap: NIL/ negative GC and Chlamydia  CBB     CS/VBAC    Support Person                  Consults: None  Significant Findings/ Diagnostic Studies:  Results for orders placed or performed during the hospital encounter of 02/11/19 (from the past 24 hour(s))  Urinalysis, Complete w Microscopic     Status: Abnormal   Collection Time:  02/11/19 10:29 AM  Result Value Ref Range   Color, Urine YELLOW (A) YELLOW   APPearance CLOUDY (A) CLEAR   Specific Gravity, Urine 1.015 1.005 - 1.030   pH 6.0 5.0 - 8.0   Glucose, UA 50 (A) NEGATIVE mg/dL   Hgb urine dipstick NEGATIVE NEGATIVE   Bilirubin Urine NEGATIVE NEGATIVE   Ketones, ur NEGATIVE NEGATIVE mg/dL   Protein, ur NEGATIVE NEGATIVE mg/dL   Nitrite NEGATIVE NEGATIVE   Leukocytes,Ua SMALL (A) NEGATIVE  Urinalysis, Microscopic (reflex)     Status: Abnormal   Collection Time: 02/11/19 10:29 AM  Result Value Ref Range   RBC / HPF 6-10 0 - 5 RBC/hpf   WBC, UA 21-50 0 - 5 WBC/hpf   Bacteria, UA MANY (A) NONE SEEN   Squamous Epithelial / LPF 6-10 0 - 5   Mucus PRESENT      Procedures:  Baseline: 150 Variability: moderate Accelerations: present Decelerations: absent Tocometry: none The patient was monitored for 30 minutes, fetal heart rate tracing was deemed reactive, category I tracing,  CPT [id number]   Discharge Condition: good  Disposition: Discharge disposition: 01-Home or Self Care       Diet: Regular diet  Discharge Activity: Activity as tolerated  Discharge Instructions    Discharge activity:  No Restrictions   Complete by:  As directed    Discharge diet:  No restrictions   Complete by:  As directed    No sexual activity restrictions   Complete by:  As directed    Notify physician for a general feeling that "something is not right"   Complete by:  As directed    Notify physician for increase or change in vaginal discharge   Complete by:  As directed    Notify physician for intestinal cramps, with or without diarrhea, sometimes described as "gas pain"   Complete by:  As directed    Notify physician for leaking of fluid   Complete by:  As directed    Notify physician for low, dull backache, unrelieved by heat or Tylenol   Complete by:  As directed    Notify physician for menstrual like cramps   Complete by:  As directed    Notify physician  for pelvic pressure   Complete by:  As directed    Notify physician for uterine contractions.  These may be painless and feel like the uterus is tightening or the baby is  "balling up"   Complete by:  As directed    Notify physician for vaginal bleeding   Complete by:  As directed    PRETERM LABOR:  Includes any of the follwing symptoms that occur between 20 - [redacted] weeks gestation.  If these symptoms are not stopped, preterm labor can result in preterm delivery, placing your baby at risk   Complete by:  As directed      Allergies as of 02/11/2019      Reactions   Amoxicillin-pot Clavulanate Nausea And Vomiting, Other (See Comments)   Projectile Vomiting vomitting Projectile Vomiting   Hydromorphone Other (See Comments)   Migraine headache Migraine headache   Latex Rash      Medication List    STOP taking these medications   acetaminophen 160 MG chewable tablet Commonly known as:  TYLENOL   promethazine 25 MG suppository Commonly known as:  Phenergan   terconazole 80 MG vaginal suppository Commonly known as:  TERAZOL 3     TAKE these medications   levothyroxine 125 MCG tablet Commonly known as:  SYNTHROID, LEVOTHROID Take one tablet Monday-Saturday and take two tablets on Sunday   metFORMIN 500 MG 24 hr tablet Commonly known as:  GLUCOPHAGE-XR Take 1 tablet by mouth daily.   multivitamin-prenatal 27-0.8 MG Tabs tablet Take 1 tablet by mouth daily at 12 noon.   nitrofurantoin (macrocrystal-monohydrate) 100 MG capsule Commonly known as:  Macrobid Take 1 capsule (100 mg total) by mouth 2 (two) times daily for 7 days.   ondansetron 4 MG disintegrating tablet Commonly known as:  Zofran ODT Take 1 tablet (4 mg total) by mouth every 6 (six) hours as needed for nausea.        Total time spent taking care of this patient: 20 minutes  Signed: Vena Austria 02/11/2019, 11:28 AM

## 2019-02-11 NOTE — OB Triage Note (Signed)
Back pain since 0500 this am, constant. Denies urinary symptoms. Back tenderness noted. Pt. Also co sharp abdominal/groin pain as well. Denies vaginal bleeding. Denies LOF. Reports good fetal movement. Victoria Becker

## 2019-02-13 LAB — URINE CULTURE
Culture: >=100000 — AB
Special Requests: NORMAL

## 2019-02-17 ENCOUNTER — Ambulatory Visit (INDEPENDENT_AMBULATORY_CARE_PROVIDER_SITE_OTHER): Payer: Managed Care, Other (non HMO) | Admitting: Maternal Newborn

## 2019-02-17 ENCOUNTER — Encounter: Payer: Self-pay | Admitting: Maternal Newborn

## 2019-02-17 ENCOUNTER — Other Ambulatory Visit: Payer: Self-pay

## 2019-02-17 VITALS — BP 110/60 | Wt 168.0 lb

## 2019-02-17 DIAGNOSIS — O0993 Supervision of high risk pregnancy, unspecified, third trimester: Secondary | ICD-10-CM

## 2019-02-17 DIAGNOSIS — Z3A32 32 weeks gestation of pregnancy: Secondary | ICD-10-CM

## 2019-02-17 DIAGNOSIS — O0990 Supervision of high risk pregnancy, unspecified, unspecified trimester: Secondary | ICD-10-CM

## 2019-02-17 LAB — POCT URINALYSIS DIPSTICK OB
Glucose, UA: NEGATIVE
POC,PROTEIN,UA: NEGATIVE

## 2019-02-17 NOTE — Progress Notes (Signed)
    Routine Prenatal Care Visit  Subjective  Victoria Becker is a 23 y.o. G1P0000 at [redacted]w[redacted]d being seen today for ongoing prenatal care.  She is currently monitored for the following issues for this high-risk pregnancy and has Supervision of high risk pregnancy, antepartum; Nausea and vomiting during pregnancy; Hyperemesis gravidarum; Hypothyroidism due to Hashimoto's thyroiditis; Nontoxic multinodular goiter; PCOS (polycystic ovarian syndrome); and Preterm contractions on their problem list.  ----------------------------------------------------------------------------------- Patient reports no complaints. Seen in triage 4/10 for a urinary tract infections, symptoms relieved with antibiotic therapy.  Contractions: Not present. Vag. Bleeding: None.  Movement: Present. No leaking of fluid.  ----------------------------------------------------------------------------------- The following portions of the patient's history were reviewed and updated as appropriate: allergies, current medications, past family history, past medical history, past social history, past surgical history and problem list. Problem list updated.  Objective  Blood pressure 110/60, weight 168 lb (76.2 kg), last menstrual period 07/01/2018. Pregravid weight 164 lb (74.4 kg) Total Weight Gain 4 lb (1.814 kg)   Urinalysis: Urine dipstick shows negative for glucose, protein.  Fetal Status: Fetal Heart Rate (bpm): 145 Fundal Height: 32 cm Movement: Present     General:  Alert, oriented and cooperative. Patient is in no acute distress.  Skin: Skin is warm and dry. No rash noted.   Cardiovascular: Normal heart rate noted  Respiratory: Normal respiratory effort, no problems with respiration noted  Abdomen: Soft, gravid, appropriate for gestational age. Pain/Pressure: Absent     Pelvic:  Cervical exam deferred        Extremities: Normal range of motion.  Edema: None  Mental Status: Normal mood and affect. Normal behavior. Normal judgment  and thought content.     Assessment   23 y.o. G1P0000 at [redacted]w[redacted]d, EDD 04/13/2019 by Ultrasound presenting for a routine prenatal visit.  Plan   FIRST Problems (from 08/17/18 to present)    Problem Noted Resolved   Supervision of high risk pregnancy, antepartum 08/17/2018 by Oswaldo Conroy, CNM No   Overview Addendum 02/04/2019 10:10 AM by Farrel Conners, CNM    [facility]inic [facility] Prenatal Labs  Dating Ultrasound at 7w Blood type: O/Positive/-- (10/15 1622)   Genetic Screen NIPS: Negative XY Antibody:Negative (10/15 1622)  Anatomic [place] Complete and normal 1/22 Rubella: 2.14 (10/15 1622) Varicella: Immune  GTT Early: 118             Third trimester: 197    3 hour: 80/202/134/124 RPR: Non Reactive (10/15 1622)   Rhogam N/A HBsAg: Negative (10/15 1622)   TDaP vaccine 02/03/2019                 Flu Shot: HIV: Non Reactive (10/15 1622)   Baby Food                                GBS:   Contraception  Pap: NIL/ negative GC and Chlamydia  CBB     CS/VBAC    Support Person                  Please refer to After Visit Summary for other counseling recommendations.   Return in about 2 weeks (around 03/03/2019) for ROB/telephone visit.  Marcelyn Bruins, CNM 02/17/2019

## 2019-02-17 NOTE — Patient Instructions (Signed)
Third Trimester of Pregnancy The third trimester is from week 28 through week 40 (months 7 through 9). The third trimester is a time when the unborn baby (fetus) is growing rapidly. At the end of the ninth month, the fetus is about 20 inches in length and weighs 6-10 pounds. Body changes during your third trimester Your body will continue to go through many changes during pregnancy. The changes vary from woman to woman. During the third trimester:  Your weight will continue to increase. You can expect to gain 25-35 pounds (11-16 kg) by the end of the pregnancy.  You may begin to get stretch marks on your hips, abdomen, and breasts.  You may urinate more often because the fetus is moving lower into your pelvis and pressing on your bladder.  You may develop or continue to have heartburn. This is caused by increased hormones that slow down muscles in the digestive tract.  You may develop or continue to have constipation because increased hormones slow digestion and cause the muscles that push waste through your intestines to relax.  You may develop hemorrhoids. These are swollen veins (varicose veins) in the rectum that can itch or be painful.  You may develop swollen, bulging veins (varicose veins) in your legs.  You may have increased body aches in the pelvis, back, or thighs. This is due to weight gain and increased hormones that are relaxing your joints.  You may have changes in your hair. These can include thickening of your hair, rapid growth, and changes in texture. Some women also have hair loss during or after pregnancy, or hair that feels dry or thin. Your hair will most likely return to normal after your baby is born.  Your breasts will continue to grow and they will continue to become tender. A yellow fluid (colostrum) may leak from your breasts. This is the first milk you are producing for your baby.  Your belly button may stick out.  You may notice more swelling in your hands,  face, or ankles.  You may have increased tingling or numbness in your hands, arms, and legs. The skin on your belly may also feel numb.  You may feel short of breath because of your expanding uterus.  You may have more problems sleeping. This can be caused by the size of your belly, increased need to urinate, and an increase in your body's metabolism.  You may notice the fetus "dropping," or moving lower in your abdomen (lightening).  You may have increased vaginal discharge.  You may notice your joints feel loose and you may have pain around your pelvic bone. What to expect at prenatal visits You will have prenatal exams every 2 weeks until week 36. Then you will have weekly prenatal exams. During a routine prenatal visit:  You will be weighed to make sure you and the baby are growing normally.  Your blood pressure will be taken.  Your abdomen will be measured to track your baby's growth.  The fetal heartbeat will be listened to.  Any test results from the previous visit will be discussed.  You may have a cervical check near your due date to see if your cervix has softened or thinned (effaced).  You will be tested for Group B streptococcus. This happens between 35 and 37 weeks. Your health care provider may ask you:  What your birth plan is.  How you are feeling.  If you are feeling the baby move.  If you have had any abnormal   symptoms, such as leaking fluid, bleeding, severe headaches, or abdominal cramping.  If you are using any tobacco products, including cigarettes, chewing tobacco, and electronic cigarettes.  If you have any questions. Other tests or screenings that may be performed during your third trimester include:  Blood tests that check for low iron levels (anemia).  Fetal testing to check the health, activity level, and growth of the fetus. Testing is done if you have certain medical conditions or if there are problems during the pregnancy.  Nonstress test  (NST). This test checks the health of your baby to make sure there are no signs of problems, such as the baby not getting enough oxygen. During this test, a belt is placed around your belly. The baby is made to move, and its heart rate is monitored during movement. What is false labor? False labor is a condition in which you feel small, irregular tightenings of the muscles in the womb (contractions) that usually go away with rest, changing position, or drinking water. These are called Braxton Hicks contractions. Contractions may last for hours, days, or even weeks before true labor sets in. If contractions come at regular intervals, become more frequent, increase in intensity, or become painful, you should see your health care provider. What are the signs of labor?  Abdominal cramps.  Regular contractions that start at 10 minutes apart and become stronger and more frequent with time.  Contractions that start on the top of the uterus and spread down to the lower abdomen and back.  Increased pelvic pressure and dull back pain.  A watery or bloody mucus discharge that comes from the vagina.  Leaking of amniotic fluid. This is also known as your "water breaking." It could be a slow trickle or a gush. Let your health care provider know if it has a color or strange odor. If you have any of these signs, call your health care provider right away, even if it is before your due date. Follow these instructions at home: Medicines  Follow your health care provider's instructions regarding medicine use. Specific medicines may be either safe or unsafe to take during pregnancy.  Take a prenatal vitamin that contains at least 600 micrograms (mcg) of folic acid.  If you develop constipation, try taking a stool softener if your health care provider approves. Eating and drinking   Eat a balanced diet that includes fresh fruits and vegetables, whole grains, good sources of protein such as meat, eggs, or tofu,  and low-fat dairy. Your health care provider will help you determine the amount of weight gain that is right for you.  Avoid raw meat and uncooked cheese. These carry germs that can cause birth defects in the baby.  If you have low calcium intake from food, talk to your health care provider about whether you should take a daily calcium supplement.  Eat four or five small meals rather than three large meals a day.  Limit foods that are high in fat and processed sugars, such as fried and sweet foods.  To prevent constipation: ? Drink enough fluid to keep your urine clear or pale yellow. ? Eat foods that are high in fiber, such as fresh fruits and vegetables, whole grains, and beans. Activity  Exercise only as directed by your health care provider. Most women can continue their usual exercise routine during pregnancy. Try to exercise for 30 minutes at least 5 days a week. Stop exercising if you experience uterine contractions.  Avoid heavy lifting.  Do   not exercise in extreme heat or humidity, or at high altitudes.  Wear low-heel, comfortable shoes.  Practice good posture.  You may continue to have sex unless your health care provider tells you otherwise. Relieving pain and discomfort  Take frequent breaks and rest with your legs elevated if you have leg cramps or low back pain.  Take warm sitz baths to soothe any pain or discomfort caused by hemorrhoids. Use hemorrhoid cream if your health care provider approves.  Wear a good support bra to prevent discomfort from breast tenderness.  If you develop varicose veins: ? Wear support pantyhose or compression stockings as told by your healthcare provider. ? Elevate your feet for 15 minutes, 3-4 times a day. Prenatal care  Write down your questions. Take them to your prenatal visits.  Keep all your prenatal visits as told by your health care provider. This is important. Safety  Wear your seat belt at all times when driving.  Make  a list of emergency phone numbers, including numbers for family, friends, the hospital, and police and fire departments. General instructions  Avoid cat litter boxes and soil used by cats. These carry germs that can cause birth defects in the baby. If you have a cat, ask someone to clean the litter box for you.  Do not travel far distances unless it is absolutely necessary and only with the approval of your health care provider.  Do not use hot tubs, steam rooms, or saunas.  Do not drink alcohol.  Do not use any products that contain nicotine or tobacco, such as cigarettes and e-cigarettes. If you need help quitting, ask your health care provider.  Do not use any medicinal herbs or unprescribed drugs. These chemicals affect the formation and growth of the baby.  Do not douche or use tampons or scented sanitary pads.  Do not cross your legs for long periods of time.  To prepare for the arrival of your baby: ? Take prenatal classes to understand, practice, and ask questions about labor and delivery. ? Make a trial run to the hospital. ? Visit the hospital and tour the maternity area. ? Arrange for maternity or paternity leave through employers. ? Arrange for family and friends to take care of pets while you are in the hospital. ? Purchase a rear-facing car seat and make sure you know how to install it in your car. ? Pack your hospital bag. ? Prepare the baby's nursery. Make sure to remove all pillows and stuffed animals from the baby's crib to prevent suffocation.  Visit your dentist if you have not gone during your pregnancy. Use a soft toothbrush to brush your teeth and be gentle when you floss. Contact a health care provider if:  You are unsure if you are in labor or if your water has broken.  You become dizzy.  You have mild pelvic cramps, pelvic pressure, or nagging pain in your abdominal area.  You have lower back pain.  You have persistent nausea, vomiting, or  diarrhea.  You have an unusual or bad smelling vaginal discharge.  You have pain when you urinate. Get help right away if:  Your water breaks before 37 weeks.  You have regular contractions less than 5 minutes apart before 37 weeks.  You have a fever.  You are leaking fluid from your vagina.  You have spotting or bleeding from your vagina.  You have severe abdominal pain or cramping.  You have rapid weight loss or weight gain.  You have   shortness of breath with chest pain.  You notice sudden or extreme swelling of your face, hands, ankles, feet, or legs.  Your baby makes fewer than 10 movements in 2 hours.  You have severe headaches that do not go away when you take medicine.  You have vision changes. Summary  The third trimester is from week 28 through week 40, months 7 through 9. The third trimester is a time when the unborn baby (fetus) is growing rapidly.  During the third trimester, your discomfort may increase as you and your baby continue to gain weight. You may have abdominal, leg, and back pain, sleeping problems, and an increased need to urinate.  During the third trimester your breasts will keep growing and they will continue to become tender. A yellow fluid (colostrum) may leak from your breasts. This is the first milk you are producing for your baby.  False labor is a condition in which you feel small, irregular tightenings of the muscles in the womb (contractions) that eventually go away. These are called Braxton Hicks contractions. Contractions may last for hours, days, or even weeks before true labor sets in.  Signs of labor can include: abdominal cramps; regular contractions that start at 10 minutes apart and become stronger and more frequent with time; watery or bloody mucus discharge that comes from the vagina; increased pelvic pressure and dull back pain; and leaking of amniotic fluid. This information is not intended to replace advice given to you by your  health care provider. Make sure you discuss any questions you have with your health care provider. Document Released: 10/14/2001 Document Revised: 11/25/2016 Document Reviewed: 11/25/2016 Elsevier Interactive Patient Education  2019 [employer]  

## 2019-03-03 ENCOUNTER — Encounter: Payer: Managed Care, Other (non HMO) | Admitting: Maternal Newborn

## 2019-03-03 ENCOUNTER — Ambulatory Visit (INDEPENDENT_AMBULATORY_CARE_PROVIDER_SITE_OTHER): Payer: Managed Care, Other (non HMO) | Admitting: Maternal Newborn

## 2019-03-03 ENCOUNTER — Other Ambulatory Visit: Payer: Self-pay

## 2019-03-03 ENCOUNTER — Encounter: Payer: Self-pay | Admitting: Maternal Newborn

## 2019-03-03 DIAGNOSIS — Z3A34 34 weeks gestation of pregnancy: Secondary | ICD-10-CM

## 2019-03-03 DIAGNOSIS — O0990 Supervision of high risk pregnancy, unspecified, unspecified trimester: Secondary | ICD-10-CM

## 2019-03-03 DIAGNOSIS — Z3403 Encounter for supervision of normal first pregnancy, third trimester: Secondary | ICD-10-CM

## 2019-03-03 NOTE — Progress Notes (Signed)
Virtual Visit via Telephone Note  I connected with Victoria Becker on 03/04/19 at  2:10 PM EDT by telephone and verified that I was speaking with the correct person using two identifiers.  Location: Patient: Home Provider: Office   I discussed the limitations, risks, security and privacy concerns of performing an evaluation and management service by telephone and the availability of in person appointments. The patient expressed understanding and agreed to proceed.  See note below for documentation of the content of this prenatal telephone visit.   I discussed the assessment and treatment plan with the patient. The patient was provided an opportunity to ask questions and all were answered. The patient agreed with the plan and demonstrated an understanding of the instructions.   The patient was advised to call back or seek an in-person evaluation if the symptoms worsen or if the condition fails to improve as anticipated.  I provided 16 minutes of non-face-to-face time during this encounter.   Oswaldo ConroyJacelyn Y Shamaria Kavan, CNM   Prenatal Telephone Visit  Subjective  Victoria Becker is a 23 y.o. G1P0000 at 5295w1d, this visit is for ongoing prenatal care.  She is currently monitored for the following issues for this high-risk pregnancy and has Supervision of high risk pregnancy, antepartum; Nausea and vomiting during pregnancy; Hyperemesis gravidarum; Hypothyroidism due to Hashimoto's thyroiditis; Nontoxic multinodular goiter; PCOS (polycystic ovarian syndrome); and Preterm contractions on their problem list.  ----------------------------------------------------------------------------------- Patient reports nausea with occasional vomiting, mostly when she is moving quickly/stressed at work.   Contractions: Not present. Vag. Bleeding: None.  Movement: Present. No leaking of fluid.  ----------------------------------------------------------------------------------- The following portions of the patient's  history were reviewed and updated as appropriate: allergies, current medications, past family history, past medical history, past social history, past surgical history and problem list. Problem list updated.  Objective  Last menstrual period 07/01/2018. Pregravid weight 164 lb (74.4 kg) Total Weight Gain 4 lb (1.814 kg) Physical exam was not performed as this was a telephone visit.  Assessment   23 y.o. G1P0000 at 5995w1d EDD 04/13/2019, by Ultrasound presenting for a prenatal telephone visit.  Plan   FIRST Problems (from 08/17/18 to present)    Problem Noted Resolved   Supervision of high risk pregnancy, antepartum 08/17/2018 by Oswaldo ConroySchmid, Edis Huish Y, CNM No   Overview Addendum 02/04/2019 10:10 AM by Farrel ConnersGutierrez, Colleen, CNM    Clinic [facility] Prenatal Labs  Dating Ultrasound at 7w Blood type: O/Positive/-- (10/15 1622)   Genetic Screen NIPS: Negative XY Antibody:Negative (10/15 1622)  Anatomic US Complete and normal 1/22 Rubella: 2.14 (10/15 1622) Varicella: Immune  GTT Early: 118             Third trimester: 197    3 hour: 80/202/134/124 RPR: Non Reactive (10/15 1622)   Rhogam N/A HBsAg: Negative (10/15 1622)   TDaP vaccine 02/03/2019                 Flu Shot: HIV: Non Reactive (10/15 1622)   Baby Food                                GBS:   Contraception  Pap: NIL/ negative GC and Chlamydia  CBB     CS/VBAC    Support Person                 Discussed precautions at work when with potentially infectious patients.  She plans to breastfeed her baby.  We discussed hormonal contraception/compatibility with breastfeeding. She would like to receive the written materials for available contraceptive methods at her next office visit.  Return in about 2 weeks (around 03/17/2019) for ROB in person.  Marcelyn Bruins, CNM 03/03/2019

## 2019-03-03 NOTE — Progress Notes (Signed)
ROB telephone visit  

## 2019-03-04 NOTE — Patient Instructions (Signed)
Third Trimester of Pregnancy The third trimester is from week 28 through week 40 (months 7 through 9). The third trimester is a time when the unborn baby (fetus) is growing rapidly. At the end of the ninth month, the fetus is about 20 inches in length and weighs 6-10 pounds. Body changes during your third trimester Your body will continue to go through many changes during pregnancy. The changes vary from woman to woman. During the third trimester:  Your weight will continue to increase. You can expect to gain 25-35 pounds (11-16 kg) by the end of the pregnancy.  You may begin to get stretch marks on your hips, abdomen, and breasts.  You may urinate more often because the fetus is moving lower into your pelvis and pressing on your bladder.  You may develop or continue to have heartburn. This is caused by increased hormones that slow down muscles in the digestive tract.  You may develop or continue to have constipation because increased hormones slow digestion and cause the muscles that push waste through your intestines to relax.  You may develop hemorrhoids. These are swollen veins (varicose veins) in the rectum that can itch or be painful.  You may develop swollen, bulging veins (varicose veins) in your legs.  You may have increased body aches in the pelvis, back, or thighs. This is due to weight gain and increased hormones that are relaxing your joints.  You may have changes in your hair. These can include thickening of your hair, rapid growth, and changes in texture. Some women also have hair loss during or after pregnancy, or hair that feels dry or thin. Your hair will most likely return to normal after your baby is born.  Your breasts will continue to grow and they will continue to become tender. A yellow fluid (colostrum) may leak from your breasts. This is the first milk you are producing for your baby.  Your belly button may stick out.  You may notice more swelling in your hands,  face, or ankles.  You may have increased tingling or numbness in your hands, arms, and legs. The skin on your belly may also feel numb.  You may feel short of breath because of your expanding uterus.  You may have more problems sleeping. This can be caused by the size of your belly, increased need to urinate, and an increase in your body's metabolism.  You may notice the fetus "dropping," or moving lower in your abdomen (lightening).  You may have increased vaginal discharge.  You may notice your joints feel loose and you may have pain around your pelvic bone. What to expect at prenatal visits You will have prenatal exams every 2 weeks until week 36. Then you will have weekly prenatal exams. During a routine prenatal visit:  You will be weighed to make sure you and the baby are growing normally.  Your blood pressure will be taken.  Your abdomen will be measured to track your baby's growth.  The fetal heartbeat will be listened to.  Any test results from the previous visit will be discussed.  You may have a cervical check near your due date to see if your cervix has softened or thinned (effaced).  You will be tested for Group B streptococcus. This happens between 35 and 37 weeks. Your health care provider may ask you:  What your birth plan is.  How you are feeling.  If you are feeling the baby move.  If you have had any abnormal   symptoms, such as leaking fluid, bleeding, severe headaches, or abdominal cramping.  If you are using any tobacco products, including cigarettes, chewing tobacco, and electronic cigarettes.  If you have any questions. Other tests or screenings that may be performed during your third trimester include:  Blood tests that check for low iron levels (anemia).  Fetal testing to check the health, activity level, and growth of the fetus. Testing is done if you have certain medical conditions or if there are problems during the pregnancy.  Nonstress test  (NST). This test checks the health of your baby to make sure there are no signs of problems, such as the baby not getting enough oxygen. During this test, a belt is placed around your belly. The baby is made to move, and its heart rate is monitored during movement. What is false labor? False labor is a condition in which you feel small, irregular tightenings of the muscles in the womb (contractions) that usually go away with rest, changing position, or drinking water. These are called Braxton Hicks contractions. Contractions may last for hours, days, or even weeks before true labor sets in. If contractions come at regular intervals, become more frequent, increase in intensity, or become painful, you should see your health care provider. What are the signs of labor?  Abdominal cramps.  Regular contractions that start at 10 minutes apart and become stronger and more frequent with time.  Contractions that start on the top of the uterus and spread down to the lower abdomen and back.  Increased pelvic pressure and dull back pain.  A watery or bloody mucus discharge that comes from the vagina.  Leaking of amniotic fluid. This is also known as your "water breaking." It could be a slow trickle or a gush. Let your health care provider know if it has a color or strange odor. If you have any of these signs, call your health care provider right away, even if it is before your due date. Follow these instructions at home: Medicines  Follow your health care provider's instructions regarding medicine use. Specific medicines may be either safe or unsafe to take during pregnancy.  Take a prenatal vitamin that contains at least 600 micrograms (mcg) of folic acid.  If you develop constipation, try taking a stool softener if your health care provider approves. Eating and drinking   Eat a balanced diet that includes fresh fruits and vegetables, whole grains, good sources of protein such as meat, eggs, or tofu,  and low-fat dairy. Your health care provider will help you determine the amount of weight gain that is right for you.  Avoid raw meat and uncooked cheese. These carry germs that can cause birth defects in the baby.  If you have low calcium intake from food, talk to your health care provider about whether you should take a daily calcium supplement.  Eat four or five small meals rather than three large meals a day.  Limit foods that are high in fat and processed sugars, such as fried and sweet foods.  To prevent constipation: ? Drink enough fluid to keep your urine clear or pale yellow. ? Eat foods that are high in fiber, such as fresh fruits and vegetables, whole grains, and beans. Activity  Exercise only as directed by your health care provider. Most women can continue their usual exercise routine during pregnancy. Try to exercise for 30 minutes at least 5 days a week. Stop exercising if you experience uterine contractions.  Avoid heavy lifting.  Do   not exercise in extreme heat or humidity, or at high altitudes.  Wear low-heel, comfortable shoes.  Practice good posture.  You may continue to have sex unless your health care provider tells you otherwise. Relieving pain and discomfort  Take frequent breaks and rest with your legs elevated if you have leg cramps or low back pain.  Take warm sitz baths to soothe any pain or discomfort caused by hemorrhoids. Use hemorrhoid cream if your health care provider approves.  Wear a good support bra to prevent discomfort from breast tenderness.  If you develop varicose veins: ? Wear support pantyhose or compression stockings as told by your healthcare provider. ? Elevate your feet for 15 minutes, 3-4 times a day. Prenatal care  Write down your questions. Take them to your prenatal visits.  Keep all your prenatal visits as told by your health care provider. This is important. Safety  Wear your seat belt at all times when driving.  Make  a list of emergency phone numbers, including numbers for family, friends, the hospital, and police and fire departments. General instructions  Avoid cat litter boxes and soil used by cats. These carry germs that can cause birth defects in the baby. If you have a cat, ask someone to clean the litter box for you.  Do not travel far distances unless it is absolutely necessary and only with the approval of your health care provider.  Do not use hot tubs, steam rooms, or saunas.  Do not drink alcohol.  Do not use any products that contain nicotine or tobacco, such as cigarettes and e-cigarettes. If you need help quitting, ask your health care provider.  Do not use any medicinal herbs or unprescribed drugs. These chemicals affect the formation and growth of the baby.  Do not douche or use tampons or scented sanitary pads.  Do not cross your legs for long periods of time.  To prepare for the arrival of your baby: ? Take prenatal classes to understand, practice, and ask questions about labor and delivery. ? Make a trial run to the hospital. ? Visit the hospital and tour the maternity area. ? Arrange for maternity or paternity leave through employers. ? Arrange for family and friends to take care of pets while you are in the hospital. ? Purchase a rear-facing car seat and make sure you know how to install it in your car. ? Pack your hospital bag. ? Prepare the baby's nursery. Make sure to remove all pillows and stuffed animals from the baby's crib to prevent suffocation.  Visit your dentist if you have not gone during your pregnancy. Use a soft toothbrush to brush your teeth and be gentle when you floss. Contact a health care provider if:  You are unsure if you are in labor or if your water has broken.  You become dizzy.  You have mild pelvic cramps, pelvic pressure, or nagging pain in your abdominal area.  You have lower back pain.  You have persistent nausea, vomiting, or  diarrhea.  You have an unusual or bad smelling vaginal discharge.  You have pain when you urinate. Get help right away if:  Your water breaks before 37 weeks.  You have regular contractions less than 5 minutes apart before 37 weeks.  You have a fever.  You are leaking fluid from your vagina.  You have spotting or bleeding from your vagina.  You have severe abdominal pain or cramping.  You have rapid weight loss or weight gain.  You have   shortness of breath with chest pain.  You notice sudden or extreme swelling of your face, hands, ankles, feet, or legs.  Your baby makes fewer than 10 movements in 2 hours.  You have severe headaches that do not go away when you take medicine.  You have vision changes. Summary  The third trimester is from week 28 through week 40, months 7 through 9. The third trimester is a time when the unborn baby (fetus) is growing rapidly.  During the third trimester, your discomfort may increase as you and your baby continue to gain weight. You may have abdominal, leg, and back pain, sleeping problems, and an increased need to urinate.  During the third trimester your breasts will keep growing and they will continue to become tender. A yellow fluid (colostrum) may leak from your breasts. This is the first milk you are producing for your baby.  False labor is a condition in which you feel small, irregular tightenings of the muscles in the womb (contractions) that eventually go away. These are called Braxton Hicks contractions. Contractions may last for hours, days, or even weeks before true labor sets in.  Signs of labor can include: abdominal cramps; regular contractions that start at 10 minutes apart and become stronger and more frequent with time; watery or bloody mucus discharge that comes from the vagina; increased pelvic pressure and dull back pain; and leaking of amniotic fluid. This information is not intended to replace advice given to you by your  health care provider. Make sure you discuss any questions you have with your health care provider. Document Released: 10/14/2001 Document Revised: 11/25/2016 Document Reviewed: 11/25/2016 Elsevier Interactive Patient Education  2019 [employer]  

## 2019-03-17 ENCOUNTER — Encounter: Payer: Managed Care, Other (non HMO) | Admitting: Obstetrics and Gynecology

## 2019-03-17 ENCOUNTER — Ambulatory Visit (INDEPENDENT_AMBULATORY_CARE_PROVIDER_SITE_OTHER): Payer: Managed Care, Other (non HMO) | Admitting: Obstetrics and Gynecology

## 2019-03-17 ENCOUNTER — Other Ambulatory Visit (HOSPITAL_COMMUNITY)
Admission: RE | Admit: 2019-03-17 | Discharge: 2019-03-17 | Disposition: A | Discharge status: Home / Self Care | Payer: Managed Care, Other (non HMO) | Source: Ambulatory Visit | Attending: Obstetrics and Gynecology | Admitting: Obstetrics and Gynecology

## 2019-03-17 ENCOUNTER — Observation Stay
Admission: EM | Admit: 2019-03-17 | Discharge: 2019-03-17 | Disposition: A | Discharge status: Home / Self Care | Payer: Managed Care, Other (non HMO) | Attending: Obstetrics & Gynecology | Admitting: Obstetrics & Gynecology

## 2019-03-17 ENCOUNTER — Other Ambulatory Visit: Payer: Self-pay

## 2019-03-17 VITALS — BP 134/88 | Wt 173.0 lb

## 2019-03-17 DIAGNOSIS — Z3A36 36 weeks gestation of pregnancy: Secondary | ICD-10-CM | POA: Insufficient documentation

## 2019-03-17 DIAGNOSIS — E063 Autoimmune thyroiditis: Secondary | ICD-10-CM

## 2019-03-17 DIAGNOSIS — Z79899 Other long term (current) drug therapy: Secondary | ICD-10-CM | POA: Insufficient documentation

## 2019-03-17 DIAGNOSIS — E282 Polycystic ovarian syndrome: Secondary | ICD-10-CM | POA: Insufficient documentation

## 2019-03-17 DIAGNOSIS — O0990 Supervision of high risk pregnancy, unspecified, unspecified trimester: Secondary | ICD-10-CM

## 2019-03-17 DIAGNOSIS — Z79890 Hormone replacement therapy (postmenopausal): Secondary | ICD-10-CM | POA: Diagnosis not present

## 2019-03-17 DIAGNOSIS — O99283 Endocrine, nutritional and metabolic diseases complicating pregnancy, third trimester: Secondary | ICD-10-CM | POA: Insufficient documentation

## 2019-03-17 DIAGNOSIS — O169 Unspecified maternal hypertension, unspecified trimester: Secondary | ICD-10-CM | POA: Diagnosis present

## 2019-03-17 DIAGNOSIS — Z113 Encounter for screening for infections with a predominantly sexual mode of transmission: Secondary | ICD-10-CM

## 2019-03-17 DIAGNOSIS — O133 Gestational [pregnancy-induced] hypertension without significant proteinuria, third trimester: Principal | ICD-10-CM | POA: Insufficient documentation

## 2019-03-17 DIAGNOSIS — O163 Unspecified maternal hypertension, third trimester: Secondary | ICD-10-CM

## 2019-03-17 DIAGNOSIS — Z7984 Long term (current) use of oral hypoglycemic drugs: Secondary | ICD-10-CM | POA: Diagnosis not present

## 2019-03-17 DIAGNOSIS — E038 Other specified hypothyroidism: Secondary | ICD-10-CM

## 2019-03-17 DIAGNOSIS — Z3685 Encounter for antenatal screening for Streptococcus B: Secondary | ICD-10-CM

## 2019-03-17 DIAGNOSIS — O210 Mild hyperemesis gravidarum: Secondary | ICD-10-CM

## 2019-03-17 LAB — COMPREHENSIVE METABOLIC PANEL
ALT: 10 U/L (ref 0–44)
AST: 12 U/L — ABNORMAL LOW (ref 15–41)
Albumin: 3.1 g/dL — ABNORMAL LOW (ref 3.5–5.0)
Alkaline Phosphatase: 112 U/L (ref 38–126)
Anion gap: 9 (ref 5–15)
BUN: <5 mg/dL — ABNORMAL LOW (ref 6–20)
CO2: 22 mmol/L (ref 22–32)
Calcium: 8.7 mg/dL — ABNORMAL LOW (ref 8.9–10.3)
Chloride: 108 mmol/L (ref 98–111)
Creatinine, Ser: 0.5 mg/dL (ref 0.44–1.00)
GFR calc Af Amer: >60 mL/min (ref >60)
GFR calc non Af Amer: >60 mL/min (ref >60)
Glucose, Bld: 92 mg/dL (ref 70–99)
Potassium: 3.7 mmol/L (ref 3.5–5.1)
Sodium: 139 mmol/L (ref 135–145)
Total Bilirubin: 0.2 mg/dL — ABNORMAL LOW (ref 0.3–1.2)
Total Protein: 6.2 g/dL — ABNORMAL LOW (ref 6.5–8.1)

## 2019-03-17 LAB — CBC
HCT: 34.5 % — ABNORMAL LOW (ref 36.0–46.0)
Hemoglobin: 11.5 g/dL — ABNORMAL LOW (ref 12.0–15.0)
MCH: 30.4 pg (ref 26.0–34.0)
MCHC: 33.3 g/dL (ref 30.0–36.0)
MCV: 91.3 fL (ref 80.0–100.0)
Platelets: 231 10*3/uL (ref 150–400)
RBC: 3.78 MIL/uL — ABNORMAL LOW (ref 3.87–5.11)
RDW: 12.7 % (ref 11.5–15.5)
WBC: 9.1 10*3/uL (ref 4.0–10.5)
nRBC: 0 % (ref 0.0–0.2)

## 2019-03-17 LAB — POCT URINALYSIS DIPSTICK OB: Glucose, UA: NEGATIVE

## 2019-03-17 LAB — PROTEIN / CREATININE RATIO, URINE
Creatinine, Urine: 206 mg/dL
Protein Creatinine Ratio: 0.16 mg/mg{Cre} — ABNORMAL HIGH (ref 0.00–0.15)
Total Protein, Urine: 32 mg/dL

## 2019-03-17 LAB — TYPE AND SCREEN
ABO/RH(D): O POS
Antibody Screen: NEGATIVE

## 2019-03-17 MED ORDER — ACETAMINOPHEN 325 MG PO TABS: 650.0000 mg | ORAL_TABLET | ORAL | Status: DC | PRN

## 2019-03-17 NOTE — Progress Notes (Signed)
ROB Seeing spots/swelling hands in feet

## 2019-03-17 NOTE — Discharge Summary (Signed)
  See FPN 

## 2019-03-17 NOTE — Progress Notes (Signed)
Routine Prenatal Care Visit  Subjective  Victoria Becker is a 23 y.o. G1P0000 at [redacted]w[redacted]d being seen today for ongoing prenatal care.  She is currently monitored for the following issues for this high-risk pregnancy and has Supervision of high risk pregnancy, antepartum; Nausea and vomiting during pregnancy; Hyperemesis gravidarum; Hypothyroidism due to Hashimoto's thyroiditis; Nontoxic multinodular goiter; PCOS (polycystic ovarian syndrome); and Preterm contractions on their problem list.  ----------------------------------------------------------------------------------- Patient reports has been seeing some black spots and increased swelling.   Contractions: Not present. Vag. Bleeding: None.  Movement: Present. Denies leaking of fluid.  ----------------------------------------------------------------------------------- The following portions of the patient's history were reviewed and updated as appropriate: allergies, current medications, past family history, past medical history, past social history, past surgical history and problem list. Problem list updated.   Objective  Blood pressure 134/88, weight 173 lb (78.5 kg), last menstrual period 07/01/2018. Pregravid weight 164 lb (74.4 kg) Total Weight Gain 9 lb (4.082 kg) Urinalysis:      Fetal Status: Fetal Heart Rate (bpm): 155 Fundal Height: 37 cm Movement: Present  Presentation: Vertex  General:  Alert, oriented and cooperative. Patient is in no acute distress.  Skin: Skin is warm and dry. No rash noted.   Cardiovascular: Normal heart rate noted  Respiratory: Normal respiratory effort, no problems with respiration noted  Abdomen: Soft, gravid, appropriate for gestational age. Pain/Pressure: Absent     Pelvic:  Cervical exam performed Dilation: Closed      Extremities: Normal range of motion.     ental Status: Normal mood and affect. Normal behavior. Normal judgment and thought content.     Assessment   23 y.o. G1P0000 at [redacted]w[redacted]d by   04/13/2019, by Ultrasound presenting for routine prenatal visit  Plan   FIRST Problems (from 08/17/18 to present)    Problem Noted Resolved   Supervision of high risk pregnancy, antepartum 08/17/2018 by Oswaldo Conroy, CNM No   Overview Addendum 03/04/2019  1:21 PM by Oswaldo Conroy, CNM    [facility]inic [facility] Prenatal Labs  Dating Ultrasound at 7w Blood type: O/Positive/-- (10/15 1622)   Genetic Screen NIPS: Negative XY Antibody:Negative (10/15 1622)  Anatomic [place] Complete and normal 1/22 Rubella: 2.14 (10/15 1622) Varicella: Immune  GTT Early: 118             Third trimester: 197    3 hour: 80/202/134/124 RPR: Non Reactive (10/15 1622)   Rhogam N/A HBsAg: Negative (10/15 1622)   TDaP vaccine 02/03/2019                 Flu Shot: HIV: Non Reactive (10/15 1622)   Baby Food Breast                               GBS:   Contraception Undecided, possibly POP or Depo. Give booklet at next visit [ ]  Pap: NIL/ negative GC and Chlamydia  CBB     CS/VBAC    Support Person                  Gestational age appropriate obstetric precautions including but not limited to vaginal bleeding, contractions, leaking of fluid and fetal movement were reviewed in detail with the patient.    - wt relatively stable for 32 week visit up 5 lbs - reports increased swelling and seeing spots.  +1 protein today and BP while still in range borderline for diastolic - will send to L&D for  preE work up  Return in about 1 week (around 03/24/2019) for ROB.  Vena AustriaAndreas Euclid Cassetta, MD, Merlinda FrederickFACOG [facility], [facility] 03/17/2019, 4:48 PM

## 2019-03-17 NOTE — OB Triage Note (Signed)
Discharge instructions provided and reviewed.  Follow up care discussed.  Pt verbalizes understanding. 

## 2019-03-17 NOTE — Progress Notes (Signed)
Pt G1P0 [redacted]w[redacted]d sent from the office for [facility] workup. Reports +FM. Denies LOF/bleeding. Reports having some braxton hicks. Pt has some swelling in the hands and feet that is non pitting. Reflexes +2. No clonus present. No HA or abd pain. Reports seeing black/purple spots in both eyes. BP q 15 minutes. Monitor applied. Will continue to monitor

## 2019-03-17 NOTE — Final Progress Note (Signed)
Physician Final Progress Note  Patient ID: Victoria Becker MRN: [id number] DOB/AGE: 24/07/1996 24 y.o.  Admit date: 03/17/2019 Admitting provider: Nadara Mustard, MD Discharge date: 03/17/2019  Admission Diagnoses: 36 weeks, Hypertension affecting pregnancy  Discharge Diagnoses:  Active Problems:   Hypertension affecting pregnancy   Consults: None  Significant Findings/ Diagnostic Studies:  Obstetrics Admission History & Physical   CC: high blood pressure  HPI:  23 y.o. G1P0000 @ [redacted]w[redacted]d (04/13/2019, by Ultrasound). Admitted on 03/17/2019:   Patient Active Problem List   Diagnosis Date Noted  . Hypertension affecting pregnancy 03/17/2019  . Preterm contractions 02/11/2019  . PCOS (polycystic ovarian syndrome) 12/28/2018  . Hyperemesis gravidarum 09/10/2018  . Nausea and vomiting during pregnancy 09/01/2018  . Supervision of high risk pregnancy, antepartum 08/17/2018  . Hypothyroidism due to Hashimoto's thyroiditis 10/23/2017  . Nontoxic multinodular goiter 10/23/2017    Presents for spots in eyes and elevated borderline BP in office.  No headache, epigastric pain, CP, SOB.  Edema worsening.  Prenatal care at: at [facility]. Pregnancy complicated by none.  ROS: A review of systems was performed and negative, except as stated in the above HPI. PMHx:  Past Medical History:  Diagnosis Date  . Amenorrhea   . Chronic pelvic pain in female   . Dyspareunia   . Dysphagia   . Endometriosis   . Enlarged thyroid   . Follicular cyst of ovary   . Heart murmur   . Heavy periods   . Hypothyroidism    HASHIMOTO'S  . IBS (irritable bowel syndrome)   . Kidney stone   . PCOS (polycystic ovarian syndrome)   . Pelvic pain in female   . Raynaud disease   . Syncope   . UTI (lower urinary tract infection)    PSHx:  Past Surgical History:  Procedure Laterality Date  . APPENDECTOMY  10/2013  . CHOLECYSTECTOMY    . COLON SURGERY  08/2013  . WISDOM TOOTH EXTRACTION  2012/2013   ONE    Medications:  Medications Prior to Admission  Medication Sig Dispense Refill Last Dose  . levothyroxine (SYNTHROID, LEVOTHROID) 125 MCG tablet Take one tablet Monday-Saturday and take two tablets on Sunday 34 tablet 0 03/17/2019 at Unknown time  . metFORMIN (GLUCOPHAGE-XR) 500 MG 24 hr tablet Take 1 tablet by mouth daily.   03/17/2019 at Unknown time  . ondansetron (ZOFRAN ODT) 4 MG disintegrating tablet Take 1 tablet (4 mg total) by mouth every 6 (six) hours as needed for nausea. 60 tablet 3 03/17/2019 at Unknown time  . Prenatal Vit-Fe Fumarate-FA (MULTIVITAMIN-PRENATAL) 27-0.8 MG TABS tablet Take 1 tablet by mouth daily at 12 noon.   03/17/2019 at Unknown time   Allergies: is allergic to amoxicillin-pot clavulanate; hydromorphone; and latex. OBHx:  OB History  Gravida Para Term Preterm AB Living  1 0 0 0 0 0  SAB TAB Ectopic Multiple Live Births  0 0 0 0      # Outcome Date GA Lbr Len/2nd Weight Sex Delivery Anes PTL Lv  1 Current            JQZ:[id number]/[id number] except as detailed in HPI.Marland Kitchen  No family history of birth defects. Soc Hx: No tob, drugs, EtOH> Works as Clinical biochemist at [employer].  Objective:   Vitals:   03/17/19 1917 03/17/19 1932  BP: 117/77 108/72  Pulse: 94 84  Resp:    Temp:     Constitutional: Well nourished, well developed female in no acute distress.  HEENT: normal Skin: Warm and dry.  Cardiovascular:Regular rate and rhythm.   Extremity: trace to 1+ bilateral pedal edema Respiratory: Clear to auscultation bilateral. Normal respiratory effort Abdomen: gravid, ND, FHT present, mild tenderness on exam Back: no CVAT Neuro: DTRs 2+, Cranial nerves grossly intact Psych: Alert and Oriented x3. No memory deficits. Normal mood and affect.  MS: normal gait, normal bilateral lower extremity ROM/strength/stability.  EFM:FHR: 150 bpm, variability: moderate,  accelerations:  Present,  decelerations:  Absent Toco: None   Perinatal info:  Blood type: O positive Rubella-  Immune Varicella -Immune TDaP Given during third trimester of this pregnancy RPR NR / HIV Neg/ HBsAg Neg   Assessment & Plan:   23 y.o. G1P0000 @ [redacted]w[redacted]d, Admitted on 03/17/2019: 36 weeks PIH, blood pressures normalized.  No lab evidence for preeclampsia.     Procedures: A NST procedure was performed with FHR monitoring and a normal baseline established, appropriate time of 20-40 minutes of evaluation, and accels >2 seen w 15x15 characteristics.  Results show a REACTIVE NST.   Results for orders placed or performed during the hospital encounter of 03/17/19  CBC  Result Value Ref Range   WBC 9.1 4.0 - 10.5 K/uL   RBC 3.78 (L) 3.87 - 5.11 MIL/uL   Hemoglobin 11.5 (L) 12.0 - 15.0 g/dL   HCT 16.1 (L) 09.6 - 04.5 %   MCV 91.3 80.0 - 100.0 fL   MCH 30.4 26.0 - 34.0 pg   MCHC 33.3 30.0 - 36.0 g/dL   RDW 40.9 81.1 - 91.4 %   Platelets 231 150 - 400 K/uL   nRBC 0.0 0.0 - 0.2 %  Comprehensive metabolic panel  Result Value Ref Range   Sodium 139 135 - 145 mmol/L   Potassium 3.7 3.5 - 5.1 mmol/L   Chloride 108 98 - 111 mmol/L   CO2 22 22 - 32 mmol/L   Glucose, Bld 92 70 - 99 mg/dL   BUN <5 (L) 6 - 20 mg/dL   Creatinine, Ser 7.82 0.44 - 1.00 mg/dL   Calcium 8.7 (L) 8.9 - 10.3 mg/dL   Total Protein 6.2 (L) 6.5 - 8.1 g/dL   Albumin 3.1 (L) 3.5 - 5.0 g/dL   AST 12 (L) 15 - 41 U/L   ALT 10 0 - 44 U/L   Alkaline Phosphatase 112 38 - 126 U/L   Total Bilirubin 0.2 (L) 0.3 - 1.2 mg/dL   GFR calc non Af Amer >60 >60 mL/min   GFR calc Af Amer >60 >60 mL/min   Anion gap 9 5 - 15  Protein / creatinine ratio, urine  Result Value Ref Range   Creatinine, Urine 206 mg/dL   Total Protein, Urine 32 mg/dL   Protein Creatinine Ratio 0.16 (H) 0.00 - 0.15 mg/mg[Cre]  Type and screen [facility]  Result Value Ref Range   ABO/RH(D) O POS    Antibody Screen NEG    Sample Expiration      03/20/2019,2359 Performed at [facility], [address] [place], [place]  [address]    Discharge Condition: good  Disposition: Discharge disposition: 01-Home or Self Care       Diet: Regular diet  Discharge Activity: Activity as tolerated  Discharge Instructions    Call MD for:   Complete by:  As directed    Worsening contractions or pain; leakage of fluid; bleeding.   Diet general   Complete by:  As directed    Increase activity slowly   Complete by:  As directed  Allergies as of 03/17/2019      Reactions   Amoxicillin-pot Clavulanate Nausea And Vomiting, Other (See Comments)   Projectile Vomiting vomitting Projectile Vomiting   Hydromorphone Other (See Comments)   Migraine headache Migraine headache   Latex Rash      Medication List    TAKE these medications   levothyroxine 125 MCG tablet Commonly known as:  SYNTHROID Take one tablet Monday-Saturday and take two tablets on Sunday   metFORMIN 500 MG 24 hr tablet Commonly known as:  GLUCOPHAGE-XR Take 1 tablet by mouth daily.   multivitamin-prenatal 27-0.8 MG Tabs tablet Take 1 tablet by mouth daily at 12 noon.   ondansetron 4 MG disintegrating tablet Commonly known as:  Zofran ODT Take 1 tablet (4 mg total) by mouth every 6 (six) hours as needed for nausea.        Total time spent taking care of this patient: 15 minutes  Signed: Letitia Libraobert Paul Harris 03/17/2019, 7:45 PM

## 2019-03-21 LAB — CERVICOVAGINAL ANCILLARY ONLY
Chlamydia: NEGATIVE
Neisseria Gonorrhea: NEGATIVE

## 2019-03-22 ENCOUNTER — Telehealth: Payer: Self-pay

## 2019-03-22 ENCOUNTER — Encounter: Payer: Self-pay | Admitting: Obstetrics & Gynecology

## 2019-03-22 ENCOUNTER — Other Ambulatory Visit: Payer: Self-pay

## 2019-03-22 ENCOUNTER — Ambulatory Visit (INDEPENDENT_AMBULATORY_CARE_PROVIDER_SITE_OTHER): Payer: Managed Care, Other (non HMO) | Admitting: Obstetrics & Gynecology

## 2019-03-22 VITALS — BP 120/80 | Wt 176.0 lb

## 2019-03-22 DIAGNOSIS — M545 Low back pain, unspecified: Secondary | ICD-10-CM

## 2019-03-22 DIAGNOSIS — R109 Unspecified abdominal pain: Secondary | ICD-10-CM

## 2019-03-22 LAB — STREP GP B CULTURE+RFLX: Strep Gp B Culture+Rflx: NEGATIVE

## 2019-03-22 NOTE — Progress Notes (Signed)
Gynecology Pelvic Pain Evaluation   Chief Complaint: Pain  History of Present Illness:   Patient is a 23 y.o. G1P0000 who LMP was Patient's last menstrual period was 07/01/2018 (approximate)., presents today for a problem visit.  She complains of pain.   Her pain is localized to the abdominal area and low back area, described as intermittent and stabbing, began today and its severity is described as severe. The pain radiates to the  back. She has these associated symptoms which include none. Patient has these modifiers which include nothing that make it better and unable to associate with any factor that make it worse.  Context includes: spontaneous today starting after 1 pm.  No recent trauma, infection, change in activity.  Pt is [redacted] weeks pregnant.  Previous evaluation: none. Prior Diagnosis: none. Previous Treatment: none.  PMHx: She  has a past medical history of Amenorrhea, Chronic pelvic pain in female, Dyspareunia, Dysphagia, Endometriosis, Enlarged thyroid, Follicular cyst of ovary, Heart murmur, Heavy periods, Hypothyroidism, IBS (irritable bowel syndrome), Kidney stone, PCOS (polycystic ovarian syndrome), Pelvic pain in female, Raynaud disease, Syncope, and UTI (lower urinary tract infection). Also,  has a past surgical history that includes Appendectomy (10/2013); Colon surgery (08/2013); Cholecystectomy; and Wisdom tooth extraction (2012/2013)., family history includes Cancer in her paternal grandfather; Cancer (age of onset: 5660) in her paternal aunt; Diabetes in her maternal grandmother, mother, paternal grandmother, and sister; Heart disease in her maternal grandmother and paternal grandmother; Hypertension in her maternal grandmother, mother, and sister; Stroke in her maternal grandmother and paternal grandmother.,  reports that she has never smoked. She has never used smokeless tobacco. She reports that she does not drink alcohol or use drugs.  She has a current medication list  which includes the following prescription(s): levothyroxine, metformin, ondansetron, and multivitamin-prenatal. Also, is allergic to amoxicillin-pot clavulanate; hydromorphone; and latex.  Review of Systems  Constitutional: Negative for chills, fever and malaise/fatigue.  HENT: Negative for congestion, sinus pain and sore throat.   Eyes: Negative for blurred vision and pain.  Respiratory: Negative for cough and wheezing.   Cardiovascular: Negative for chest pain and leg swelling.  Gastrointestinal: Negative for abdominal pain, constipation, diarrhea, heartburn, nausea and vomiting.  Genitourinary: Negative for dysuria, frequency, hematuria and urgency.  Musculoskeletal: Negative for back pain, joint pain, myalgias and neck pain.  Skin: Negative for itching and rash.  Neurological: Negative for dizziness, tremors and weakness.  Endo/Heme/Allergies: Does not bruise/bleed easily.  Psychiatric/Behavioral: Negative for depression. The patient is not nervous/anxious and does not have insomnia.     Objective: BP 120/80   Wt 176 lb (79.8 kg)   LMP 07/01/2018 (Approximate)   BMI 38.09 kg/m  Physical Exam Constitutional:      General: She is not in acute distress.    Appearance: She is well-developed.  Genitourinary:     Pelvic exam was performed with patient supine.     Vagina and uterus normal.     No vaginal erythema or bleeding.     No cervical motion tenderness, discharge, polyp or nabothian cyst.     Uterus is mobile.     Uterus is not enlarged.     No uterine mass detected.    Uterus is midaxial.     No right or left adnexal mass present.     Right adnexa not tender.     Left adnexa not tender.     Genitourinary Comments: Cervix 0/20/-3 Vtx  HENT:     Head: Normocephalic and atraumatic.  Nose: Nose normal.  Abdominal:     General: There is no distension.     Palpations: Abdomen is soft.     Tenderness: There is no abdominal tenderness.     Comments: FHT 150s   Musculoskeletal: Normal range of motion.  Neurological:     Mental Status: She is alert and oriented to person, place, and time.     Cranial Nerves: No cranial nerve deficit.  Skin:    General: Skin is warm and dry.    Female chaperone present for pelvic portion of the physical exam  Assessment: 23 y.o. G1P0000 with No s/sx PTL. Pain is in low back and abdomen, could be GI in origin or Deberah Pelton..  1. Acute midline low back pain without sciatica Heat, rest, stretching  2. Abdominal cramping Hydration, rest Monitor for s/sx PTL  A total of 15 minutes were spent face-to-face with the patient during this encounter and over half of that time dealt with counseling and coordination of care.  Annamarie Major, MD, [facility], [facility] 03/22/2019  3:38 PM

## 2019-03-22 NOTE — Telephone Encounter (Signed)
Patient states she is having contractions. She just passed a "weird clear watery looking thing". Cb#[phone]

## 2019-03-22 NOTE — Telephone Encounter (Signed)
Spoke w/pt. States she has been having ctx (feels like someone is squeezing her whole stomach) since 1:40 every 5-10 minutes. Having back pain. Lost mucus plug? Yellowish clear. RPH approved to see pt in office. Added to schedule today at 330. Pt aware of check in.

## 2019-03-24 ENCOUNTER — Ambulatory Visit (INDEPENDENT_AMBULATORY_CARE_PROVIDER_SITE_OTHER): Payer: Managed Care, Other (non HMO) | Admitting: Maternal Newborn

## 2019-03-24 ENCOUNTER — Encounter: Payer: Self-pay | Admitting: Maternal Newborn

## 2019-03-24 ENCOUNTER — Other Ambulatory Visit: Payer: Self-pay

## 2019-03-24 VITALS — BP 100/70 | Wt 175.0 lb

## 2019-03-24 DIAGNOSIS — O320XX0 Maternal care for unstable lie, not applicable or unspecified: Secondary | ICD-10-CM

## 2019-03-24 DIAGNOSIS — Z3A37 37 weeks gestation of pregnancy: Secondary | ICD-10-CM

## 2019-03-24 DIAGNOSIS — O0990 Supervision of high risk pregnancy, unspecified, unspecified trimester: Secondary | ICD-10-CM

## 2019-03-24 LAB — POCT URINALYSIS DIPSTICK OB
Glucose, UA: NEGATIVE
POC,PROTEIN,UA: NEGATIVE

## 2019-03-24 NOTE — Progress Notes (Signed)
Routine Prenatal Care Visit  Subjective  Victoria Becker is a 23 y.o. G1P0000 at 3742w1d being seen today for ongoing prenatal care.  She is currently monitored for the following issues for this high-risk pregnancy and has Supervision of high risk pregnancy, antepartum; Nausea and vomiting during pregnancy; Hyperemesis gravidarum; Hypothyroidism due to Hashimoto's thyroiditis; Nontoxic multinodular goiter; PCOS (polycystic ovarian syndrome); Preterm contractions; and Hypertension affecting pregnancy on their problem list.  ----------------------------------------------------------------------------------- Patient reports intermittent sharp pains in her vaginal area. Had some brownish mucus discharge today. May be having some contractions but uncertain if that is what she is feeling. Contractions: Irritability. Vag. Bleeding: None.  Movement: Present. No leaking of fluid.  ----------------------------------------------------------------------------------- The following portions of the patient's history were reviewed and updated as appropriate: allergies, current medications, past family history, past medical history, past social history, past surgical history and problem list. Problem list updated.  Objective  Blood pressure 100/70, weight 175 lb (79.4 kg), last menstrual period 07/01/2018. Pregravid weight 164 lb (74.4 kg) Total Weight Gain 11 lb (4.99 kg) Urinalysis: Urine dipstick shows negative for glucose, protein. Fetal Status: Fetal Heart Rate (bpm): 152   Movement: Present  Presentation: Vertex  General:  Alert, oriented and cooperative. Patient is in no acute distress.  Skin: Skin is warm and dry. No rash noted.   Cardiovascular: Normal heart rate noted  Respiratory: Normal respiratory effort, no problems with respiration noted  Abdomen: Soft, gravid, appropriate for gestational age. Pain/Pressure: Present     Pelvic:  Cervical exam performed Dilation: 1 Effacement (%): 40 Station:  Ballotable  Extremities: Normal range of motion.  Edema: Trace  Mental Status: Normal mood and affect. Normal behavior. Normal judgment and thought content.     Assessment   23 y.o. G1P0000 at 7442w1d, EDD 04/13/2019 by Ultrasound presenting for a routine prenatal visit.  Plan   FIRST Problems (from 08/17/18 to present)    Problem Noted Resolved   Supervision of high risk pregnancy, antepartum 08/17/2018 by Oswaldo ConroySchmid, [place] Sikkema Y, CNM No   Overview Addendum 03/04/2019  1:21 PM by Oswaldo ConroySchmid, Monica Zahler Y, CNM    [facility]inic [facility] Prenatal Labs  Dating Ultrasound at 7w Blood type: O/Positive/-- (10/15 1622)   Genetic Screen NIPS: Negative XY Antibody:Negative (10/15 1622)  Anatomic US Complete and normal 1/22 Rubella: 2.14 (10/15 1622) Varicella: Immune  GTT Early: 118             Third trimester: 197    3 hour: 80/202/134/124 RPR: Non Reactive (10/15 1622)   Rhogam N/A HBsAg: Negative (10/15 1622)   TDaP vaccine 02/03/2019                 Flu Shot: HIV: Non Reactive (10/15 1622)   Baby Food Breast                               GBS:   Contraception Undecided, possibly POP or Depo. Give booklet at next visit [ ]  Pap: NIL/ negative GC and Chlamydia  CBB     CS/VBAC    Support Person               Vertex position today, but possible unstable lie. She is feeling lots of movements from baby and has the feeling that he has been changing positions.   Term labor symptoms and general obstetric precautions including but not limited to vaginal bleeding, contractions, leaking of fluid and fetal movement were reviewed.  Please refer to After Visit Summary for other counseling recommendations.   Return in about 1 week (around 03/31/2019) for ROB.  Marcelyn Bruins, CNM 03/24/2019  4:50 PM

## 2019-03-24 NOTE — Patient Instructions (Signed)
Third Trimester of Pregnancy The third trimester is from week 28 through week 40 (months 7 through 9). The third trimester is a time when the unborn baby (fetus) is growing rapidly. At the end of the ninth month, the fetus is about 20 inches in length and weighs 6-10 pounds. Body changes during your third trimester Your body will continue to go through many changes during pregnancy. The changes vary from woman to woman. During the third trimester:  Your weight will continue to increase. You can expect to gain 25-35 pounds (11-16 kg) by the end of the pregnancy.  You may begin to get stretch marks on your hips, abdomen, and breasts.  You may urinate more often because the fetus is moving lower into your pelvis and pressing on your bladder.  You may develop or continue to have heartburn. This is caused by increased hormones that slow down muscles in the digestive tract.  You may develop or continue to have constipation because increased hormones slow digestion and cause the muscles that push waste through your intestines to relax.  You may develop hemorrhoids. These are swollen veins (varicose veins) in the rectum that can itch or be painful.  You may develop swollen, bulging veins (varicose veins) in your legs.  You may have increased body aches in the pelvis, back, or thighs. This is due to weight gain and increased hormones that are relaxing your joints.  You may have changes in your hair. These can include thickening of your hair, rapid growth, and changes in texture. Some women also have hair loss during or after pregnancy, or hair that feels dry or thin. Your hair will most likely return to normal after your baby is born.  Your breasts will continue to grow and they will continue to become tender. A yellow fluid (colostrum) may leak from your breasts. This is the first milk you are producing for your baby.  Your belly button may stick out.  You may notice more swelling in your hands,  face, or ankles.  You may have increased tingling or numbness in your hands, arms, and legs. The skin on your belly may also feel numb.  You may feel short of breath because of your expanding uterus.  You may have more problems sleeping. This can be caused by the size of your belly, increased need to urinate, and an increase in your body's metabolism.  You may notice the fetus "dropping," or moving lower in your abdomen (lightening).  You may have increased vaginal discharge.  You may notice your joints feel loose and you may have pain around your pelvic bone. What to expect at prenatal visits You will have prenatal exams every 2 weeks until week 36. Then you will have weekly prenatal exams. During a routine prenatal visit:  You will be weighed to make sure you and the baby are growing normally.  Your blood pressure will be taken.  Your abdomen will be measured to track your baby's growth.  The fetal heartbeat will be listened to.  Any test results from the previous visit will be discussed.  You may have a cervical check near your due date to see if your cervix has softened or thinned (effaced).  You will be tested for Group B streptococcus. This happens between 35 and 37 weeks. Your health care provider may ask you:  What your birth plan is.  How you are feeling.  If you are feeling the baby move.  If you have had any abnormal   symptoms, such as leaking fluid, bleeding, severe headaches, or abdominal cramping.  If you are using any tobacco products, including cigarettes, chewing tobacco, and electronic cigarettes.  If you have any questions. Other tests or screenings that may be performed during your third trimester include:  Blood tests that check for low iron levels (anemia).  Fetal testing to check the health, activity level, and growth of the fetus. Testing is done if you have certain medical conditions or if there are problems during the pregnancy.  Nonstress test  (NST). This test checks the health of your baby to make sure there are no signs of problems, such as the baby not getting enough oxygen. During this test, a belt is placed around your belly. The baby is made to move, and its heart rate is monitored during movement. What is false labor? False labor is a condition in which you feel small, irregular tightenings of the muscles in the womb (contractions) that usually go away with rest, changing position, or drinking water. These are called Braxton Hicks contractions. Contractions may last for hours, days, or even weeks before true labor sets in. If contractions come at regular intervals, become more frequent, increase in intensity, or become painful, you should see your health care provider. What are the signs of labor?  Abdominal cramps.  Regular contractions that start at 10 minutes apart and become stronger and more frequent with time.  Contractions that start on the top of the uterus and spread down to the lower abdomen and back.  Increased pelvic pressure and dull back pain.  A watery or bloody mucus discharge that comes from the vagina.  Leaking of amniotic fluid. This is also known as your "water breaking." It could be a slow trickle or a gush. Let your health care provider know if it has a color or strange odor. If you have any of these signs, call your health care provider right away, even if it is before your due date. Follow these instructions at home: Medicines  Follow your health care provider's instructions regarding medicine use. Specific medicines may be either safe or unsafe to take during pregnancy.  Take a prenatal vitamin that contains at least 600 micrograms (mcg) of folic acid.  If you develop constipation, try taking a stool softener if your health care provider approves. Eating and drinking   Eat a balanced diet that includes fresh fruits and vegetables, whole grains, good sources of protein such as meat, eggs, or tofu,  and low-fat dairy. Your health care provider will help you determine the amount of weight gain that is right for you.  Avoid raw meat and uncooked cheese. These carry germs that can cause birth defects in the baby.  If you have low calcium intake from food, talk to your health care provider about whether you should take a daily calcium supplement.  Eat four or five small meals rather than three large meals a day.  Limit foods that are high in fat and processed sugars, such as fried and sweet foods.  To prevent constipation: ? Drink enough fluid to keep your urine clear or pale yellow. ? Eat foods that are high in fiber, such as fresh fruits and vegetables, whole grains, and beans. Activity  Exercise only as directed by your health care provider. Most women can continue their usual exercise routine during pregnancy. Try to exercise for 30 minutes at least 5 days a week. Stop exercising if you experience uterine contractions.  Avoid heavy lifting.  Do   not exercise in extreme heat or humidity, or at high altitudes.  Wear low-heel, comfortable shoes.  Practice good posture.  You may continue to have sex unless your health care provider tells you otherwise. Relieving pain and discomfort  Take frequent breaks and rest with your legs elevated if you have leg cramps or low back pain.  Take warm sitz baths to soothe any pain or discomfort caused by hemorrhoids. Use hemorrhoid cream if your health care provider approves.  Wear a good support bra to prevent discomfort from breast tenderness.  If you develop varicose veins: ? Wear support pantyhose or compression stockings as told by your healthcare provider. ? Elevate your feet for 15 minutes, 3-4 times a day. Prenatal care  Write down your questions. Take them to your prenatal visits.  Keep all your prenatal visits as told by your health care provider. This is important. Safety  Wear your seat belt at all times when driving.  Make  a list of emergency phone numbers, including numbers for family, friends, the hospital, and police and fire departments. General instructions  Avoid cat litter boxes and soil used by cats. These carry germs that can cause birth defects in the baby. If you have a cat, ask someone to clean the litter box for you.  Do not travel far distances unless it is absolutely necessary and only with the approval of your health care provider.  Do not use hot tubs, steam rooms, or saunas.  Do not drink alcohol.  Do not use any products that contain nicotine or tobacco, such as cigarettes and e-cigarettes. If you need help quitting, ask your health care provider.  Do not use any medicinal herbs or unprescribed drugs. These chemicals affect the formation and growth of the baby.  Do not douche or use tampons or scented sanitary pads.  Do not cross your legs for long periods of time.  To prepare for the arrival of your baby: ? Take prenatal classes to understand, practice, and ask questions about labor and delivery. ? Make a trial run to the hospital. ? Visit the hospital and tour the maternity area. ? Arrange for maternity or paternity leave through employers. ? Arrange for family and friends to take care of pets while you are in the hospital. ? Purchase a rear-facing car seat and make sure you know how to install it in your car. ? Pack your hospital bag. ? Prepare the baby's nursery. Make sure to remove all pillows and stuffed animals from the baby's crib to prevent suffocation.  Visit your dentist if you have not gone during your pregnancy. Use a soft toothbrush to brush your teeth and be gentle when you floss. Contact a health care provider if:  You are unsure if you are in labor or if your water has broken.  You become dizzy.  You have mild pelvic cramps, pelvic pressure, or nagging pain in your abdominal area.  You have lower back pain.  You have persistent nausea, vomiting, or  diarrhea.  You have an unusual or bad smelling vaginal discharge.  You have pain when you urinate. Get help right away if:  Your water breaks before 37 weeks.  You have regular contractions less than 5 minutes apart before 37 weeks.  You have a fever.  You are leaking fluid from your vagina.  You have spotting or bleeding from your vagina.  You have severe abdominal pain or cramping.  You have rapid weight loss or weight gain.  You have   shortness of breath with chest pain.  You notice sudden or extreme swelling of your face, hands, ankles, feet, or legs.  Your baby makes fewer than 10 movements in 2 hours.  You have severe headaches that do not go away when you take medicine.  You have vision changes. Summary  The third trimester is from week 28 through week 40, months 7 through 9. The third trimester is a time when the unborn baby (fetus) is growing rapidly.  During the third trimester, your discomfort may increase as you and your baby continue to gain weight. You may have abdominal, leg, and back pain, sleeping problems, and an increased need to urinate.  During the third trimester your breasts will keep growing and they will continue to become tender. A yellow fluid (colostrum) may leak from your breasts. This is the first milk you are producing for your baby.  False labor is a condition in which you feel small, irregular tightenings of the muscles in the womb (contractions) that eventually go away. These are called Braxton Hicks contractions. Contractions may last for hours, days, or even weeks before true labor sets in.  Signs of labor can include: abdominal cramps; regular contractions that start at 10 minutes apart and become stronger and more frequent with time; watery or bloody mucus discharge that comes from the vagina; increased pelvic pressure and dull back pain; and leaking of amniotic fluid. This information is not intended to replace advice given to you by your  health care provider. Make sure you discuss any questions you have with your health care provider. Document Released: 10/14/2001 Document Revised: 11/25/2016 Document Reviewed: 11/25/2016 Elsevier Interactive Patient Education  2019 [employer]  

## 2019-03-24 NOTE — Progress Notes (Signed)
ROB- had big brownish/greenish discharge/clump this morning

## 2019-03-31 ENCOUNTER — Other Ambulatory Visit: Payer: Self-pay

## 2019-03-31 ENCOUNTER — Ambulatory Visit (INDEPENDENT_AMBULATORY_CARE_PROVIDER_SITE_OTHER): Payer: Managed Care, Other (non HMO) | Admitting: Obstetrics & Gynecology

## 2019-03-31 ENCOUNTER — Telehealth: Payer: Self-pay

## 2019-03-31 VITALS — BP 100/60 | Wt 175.0 lb

## 2019-03-31 DIAGNOSIS — O99283 Endocrine, nutritional and metabolic diseases complicating pregnancy, third trimester: Secondary | ICD-10-CM

## 2019-03-31 DIAGNOSIS — O0990 Supervision of high risk pregnancy, unspecified, unspecified trimester: Secondary | ICD-10-CM

## 2019-03-31 DIAGNOSIS — Z3A38 38 weeks gestation of pregnancy: Secondary | ICD-10-CM

## 2019-03-31 DIAGNOSIS — E063 Autoimmune thyroiditis: Secondary | ICD-10-CM

## 2019-03-31 DIAGNOSIS — E038 Other specified hypothyroidism: Secondary | ICD-10-CM

## 2019-03-31 NOTE — Telephone Encounter (Signed)
FMLA/DISABILITY form for [employer] filled out, signature obtained, and given to KT for processing. 

## 2019-03-31 NOTE — Progress Notes (Signed)
  Subjective  Fetal Movement? yes Contractions? no Leaking Fluid? no Vaginal Bleeding? no  Objective  BP 100/60   Wt 175 lb (79.4 kg)   LMP 07/01/2018 (Approximate)   BMI 37.87 kg/m  General: NAD Pumonary: no increased work of breathing Abdomen: gravid, non-tender Extremities: no edema Psychiatric: mood appropriate, affect full  Assessment  23 y.o. G1P0000 at [redacted]w[redacted]d by  04/13/2019, by Ultrasound presenting for routine prenatal visit  Plan   Problem List Items Addressed This Visit      Endocrine   Hypothyroidism due to Hashimoto's thyroiditis     Other   Supervision of high risk pregnancy, antepartum    Other Visit Diagnoses    [redacted] weeks gestation of pregnancy    -  Primary    PNV,FMC, Labor precautions  FIRST Problems (from 08/17/18 to present)    Problem Noted Resolved   Supervision of high risk pregnancy, antepartum 08/17/2018 by Oswaldo Conroy, CNM No   Overview Addendum 03/04/2019  1:21 PM by Oswaldo Conroy, CNM    [facility]inic [facility] Prenatal Labs  Dating Ultrasound at 7w Blood type: O/Positive/-- (10/15 1622)   Genetic Screen NIPS: Negative XY Antibody:Negative (10/15 1622)  Anatomic [place] Complete and normal 1/22 Rubella: 2.14 (10/15 1622) Varicella: Immune  GTT Early: 118             Third trimester: 197    3 hour: 80/202/134/124 RPR: Non Reactive (10/15 1622)   Rhogam N/A HBsAg: Negative (10/15 1622)   TDaP vaccine 02/03/2019                 Flu Shot: HIV: Non Reactive (10/15 1622)   Baby Food Breast                               GBS:   Contraception Undecided, possibly POP or Depo. Give booklet at next visit [ ]  Pap: NIL/ negative GC and Chlamydia  CBB     CS/VBAC    Support Person                  Annamarie Major, MD, [facility], [facility] 03/31/2019  4:26 PM

## 2019-03-31 NOTE — Patient Instructions (Signed)
Braxton Hicks Contractions Contractions of the uterus can occur throughout pregnancy, but they are not always a sign that you are in labor. You may have practice contractions called Braxton Hicks contractions. These false labor contractions are sometimes confused with true labor. What are Braxton Hicks contractions? Braxton Hicks contractions are tightening movements that occur in the muscles of the uterus before labor. Unlike true labor contractions, these contractions do not result in opening (dilation) and thinning of the cervix. Toward the end of pregnancy (32-34 weeks), Braxton Hicks contractions can happen more often and may become stronger. These contractions are sometimes difficult to tell apart from true labor because they can be very uncomfortable. You should not feel embarrassed if you go to the hospital with false labor. Sometimes, the only way to tell if you are in true labor is for your health care provider to look for changes in the cervix. The health care provider will do a physical exam and may monitor your contractions. If you are not in true labor, the exam should show that your cervix is not dilating and your water has not broken. If there are no other health problems associated with your pregnancy, it is completely safe for you to be sent home with false labor. You may continue to have Braxton Hicks contractions until you go into true labor. How to tell the difference between true labor and false labor True labor  Contractions last 30-70 seconds.  Contractions become very regular.  Discomfort is usually felt in the top of the uterus, and it spreads to the lower abdomen and low back.  Contractions do not go away with walking.  Contractions usually become more intense and increase in frequency.  The cervix dilates and gets thinner. False labor  Contractions are usually shorter and not as strong as true labor contractions.  Contractions are usually irregular.  Contractions  are often felt in the front of the lower abdomen and in the groin.  Contractions may go away when you walk around or change positions while lying down.  Contractions get weaker and are shorter-lasting as time goes on.  The cervix usually does not dilate or become thin. Follow these instructions at home:   Take over-the-counter and prescription medicines only as told by your health care provider.  Keep up with your usual exercises and follow other instructions from your health care provider.  Eat and drink lightly if you think you are going into labor.  If Braxton Hicks contractions are making you uncomfortable: ? Change your position from lying down or resting to walking, or change from walking to resting. ? Sit and rest in a tub of warm water. ? Drink enough fluid to keep your urine pale yellow. Dehydration may cause these contractions. ? Do slow and deep breathing several times an hour.  Keep all follow-up prenatal visits as told by your health care provider. This is important. Contact a health care provider if:  You have a fever.  You have continuous pain in your abdomen. Get help right away if:  Your contractions become stronger, more regular, and closer together.  You have fluid leaking or gushing from your vagina.  You pass blood-tinged mucus (bloody show).  You have bleeding from your vagina.  You have low back pain that you never had before.  You feel your baby's head pushing down and causing pelvic pressure.  Your baby is not moving inside you as much as it used to. Summary  Contractions that occur before labor are   called Braxton Hicks contractions, false labor, or practice contractions.  Braxton Hicks contractions are usually shorter, weaker, farther apart, and less regular than true labor contractions. True labor contractions usually become progressively stronger and regular, and they become more frequent.  Manage discomfort from Braxton Hicks contractions  by changing position, resting in a warm bath, drinking plenty of water, or practicing deep breathing. This information is not intended to replace advice given to you by your health care provider. Make sure you discuss any questions you have with your health care provider. Document Released: 03/05/2017 Document Revised: 08/04/2017 Document Reviewed: 03/05/2017 Elsevier Interactive Patient Education  2019 [employer]  

## 2019-04-07 ENCOUNTER — Encounter: Payer: Self-pay | Admitting: Maternal Newborn

## 2019-04-07 ENCOUNTER — Other Ambulatory Visit: Payer: Self-pay

## 2019-04-07 ENCOUNTER — Ambulatory Visit (INDEPENDENT_AMBULATORY_CARE_PROVIDER_SITE_OTHER): Payer: Managed Care, Other (non HMO) | Admitting: Maternal Newborn

## 2019-04-07 VITALS — BP 104/64 | Wt 179.0 lb

## 2019-04-07 DIAGNOSIS — Z3A39 39 weeks gestation of pregnancy: Secondary | ICD-10-CM

## 2019-04-07 DIAGNOSIS — O0990 Supervision of high risk pregnancy, unspecified, unspecified trimester: Secondary | ICD-10-CM

## 2019-04-07 DIAGNOSIS — O10913 Unspecified pre-existing hypertension complicating pregnancy, third trimester: Secondary | ICD-10-CM

## 2019-04-07 LAB — POCT URINALYSIS DIPSTICK OB
Glucose, UA: NEGATIVE
POC,PROTEIN,UA: NEGATIVE

## 2019-04-07 NOTE — Patient Instructions (Signed)

## 2019-04-07 NOTE — Progress Notes (Signed)
Routine Prenatal Care Visit  Subjective  Victoria Becker is a 23 y.o. G1P0000 at 1811w1d being seen today for ongoing prenatal care.  She is currently monitored for the following issues for this high-risk pregnancy and has Supervision of high risk pregnancy, antepartum; Nausea and vomiting during pregnancy; Hyperemesis gravidarum; Hypothyroidism due to Hashimoto's thyroiditis; Nontoxic multinodular goiter; PCOS (polycystic ovarian syndrome); Preterm contractions; and Hypertension affecting pregnancy on their problem list.  ----------------------------------------------------------------------------------- Patient reports lower back pain, occasional contractions.   Contractions: Irritability. Vag. Bleeding: None.  Movement: Present. No leaking of fluid.  ----------------------------------------------------------------------------------- The following portions of the patient's history were reviewed and updated as appropriate: allergies, current medications, past family history, past medical history, past social history, past surgical history and problem list. Problem list updated.  Objective  Blood pressure 104/64, weight 179 lb (81.2 kg), last menstrual period 07/01/2018. Pregravid weight 164 lb (74.4 kg) Total Weight Gain 15 lb (6.804 kg) Urinalysis: Urine dipstick shows negative for glucose, protein.  Fetal Status: Fetal Heart Rate (bpm): 144 Fundal Height: 39 cm Movement: Present  Presentation: Vertex  General:  Alert, oriented and cooperative. Patient is in no acute distress.  Skin: Skin is warm and dry. No rash noted.   Cardiovascular: Normal heart rate noted  Respiratory: Normal respiratory effort, no problems with respiration noted  Abdomen: Soft, gravid, appropriate for gestational age. Pain/Pressure: Absent     Pelvic:  Cervical exam performed Dilation: 1.5 Effacement (%): 50 Station: -3  Extremities: Normal range of motion.  Edema: Trace  Mental Status: Normal mood and affect. Normal  behavior. Normal judgment and thought content.     Assessment   23 y.o. G1P0000 at 5811w1d, EDD 04/13/2019 by Ultrasound presenting for a routine prenatal visit.  Plan   FIRST Problems (from 08/17/18 to present)    Problem Noted Resolved   Supervision of high risk pregnancy, antepartum 08/17/2018 by Oswaldo ConroySchmid, Debbra Digiulio Y, CNM No   Overview Addendum 03/04/2019  1:21 PM by Oswaldo ConroySchmid, Shai Rasmussen Y, CNM    [facility]inic [facility] Prenatal Labs  Dating Ultrasound at 7w Blood type: O/Positive/-- (10/15 1622)   Genetic Screen NIPS: Negative XY Antibody:Negative (10/15 1622)  Anatomic US Complete and normal 1/22 Rubella: 2.14 (10/15 1622) Varicella: Immune  GTT Early: 118             Third trimester: 197    3 hour: 80/202/134/124 RPR: Non Reactive (10/15 1622)   Rhogam N/A HBsAg: Negative (10/15 1622)   TDaP vaccine 02/03/2019                 Flu Shot: HIV: Non Reactive (10/15 1622)   Baby Food Breast                               GBS:   Contraception Undecided, possibly POP or Depo. Give booklet at next visit [ ]  Pap: NIL/ negative GC and Chlamydia  CBB     CS/VBAC    Support Person               Discussed planning for labor induction, desires 41 week IOL if spontaneous labor does not happen before that (or earlier with favorable cervix). Plan to schedule at next visit when we'll be within the 10 day scheduling window. Talked about COVID testing and self-quarantine for planned procedures.  Booklet given about postpartum contraceptive choices.  Term labor symptoms and general obstetric precautions were reviewed.  Please refer to After  Visit Summary for other counseling recommendations.   Return in about 1 week (around 04/14/2019) for ROB.  Marcelyn Bruins, CNM 04/07/2019  4:27 PM

## 2019-04-07 NOTE — Progress Notes (Signed)
No vb. No lof. Some back pain. Cervical check today.

## 2019-04-11 ENCOUNTER — Other Ambulatory Visit: Payer: Self-pay

## 2019-04-11 ENCOUNTER — Ambulatory Visit (INDEPENDENT_AMBULATORY_CARE_PROVIDER_SITE_OTHER): Payer: Managed Care, Other (non HMO) | Admitting: Certified Nurse Midwife

## 2019-04-11 VITALS — BP 118/78 | Wt 179.0 lb

## 2019-04-11 DIAGNOSIS — Z3493 Encounter for supervision of normal pregnancy, unspecified, third trimester: Secondary | ICD-10-CM

## 2019-04-11 DIAGNOSIS — O0990 Supervision of high risk pregnancy, unspecified, unspecified trimester: Secondary | ICD-10-CM

## 2019-04-11 DIAGNOSIS — Z3490 Encounter for supervision of normal pregnancy, unspecified, unspecified trimester: Secondary | ICD-10-CM

## 2019-04-11 DIAGNOSIS — Z3A39 39 weeks gestation of pregnancy: Secondary | ICD-10-CM

## 2019-04-11 DIAGNOSIS — Z01818 Encounter for other preprocedural examination: Secondary | ICD-10-CM

## 2019-04-11 LAB — POCT URINALYSIS DIPSTICK OB: Glucose, UA: NEGATIVE

## 2019-04-11 MED ORDER — FAMOTIDINE 20 MG PO TABS: 20.0000 mg | ORAL_TABLET | Freq: Two times a day (BID) | ORAL | 0 refills | Status: DC

## 2019-04-11 NOTE — Progress Notes (Signed)
No complaints

## 2019-04-12 NOTE — Progress Notes (Signed)
ROB at 39wk 5days: Good fetal movement. No bleeding. No leakage of fluid. Wants to discuss induction of labor. Very uncomfortable Irregular, infrequent contractions. Lots of pressure and "spasms in her right lower back. "throat is on fire" despite Tums. Trouble sleeping at night. FHTs WNL. Cervix: 1/50%/-2/mid/soft  A: IUP at 39wk5 days Patient desires elective induction Heartburn  P: Discussed the Arrive study results citing no increased risk of CS with induction after 39 weeks. Did discuss longer hospital stay and probable need for serial induction. Explained risks of hyperstimulation, failed induction, fetal intolerance to labor, and Cesarean section. Discussed methods used to help get women in labor. Patient wishes to schedule IOL. Scheduled for 12 June at 0800.Ordered Covid testing on 10 June as OP. RX: Pepcid 20 mgm BID to pharmacy Dalia Heading, CNM

## 2019-04-13 ENCOUNTER — Ambulatory Visit
Admission: RE | Admit: 2019-04-13 | Discharge: 2019-04-13 | Disposition: A | Discharge status: Home / Self Care | Payer: BC Managed Care – PPO | Source: Ambulatory Visit | Attending: Certified Nurse Midwife | Admitting: Certified Nurse Midwife

## 2019-04-13 ENCOUNTER — Other Ambulatory Visit: Payer: Self-pay

## 2019-04-13 DIAGNOSIS — Z1159 Encounter for screening for other viral diseases: Secondary | ICD-10-CM | POA: Insufficient documentation

## 2019-04-13 DIAGNOSIS — Z01812 Encounter for preprocedural laboratory examination: Secondary | ICD-10-CM | POA: Insufficient documentation

## 2019-04-14 LAB — NOVEL CORONAVIRUS, NAA (HOSP ORDER, SEND-OUT TO REF LAB; TAT 18-24 HRS): SARS-CoV-2, NAA: NOT DETECTED

## 2019-04-15 ENCOUNTER — Inpatient Hospital Stay
Admission: EM | Admit: 2019-04-15 | Discharge: 2019-04-19 | DRG: 787 | Disposition: A | Discharge status: Home / Self Care | Payer: BC Managed Care – PPO | Attending: Certified Nurse Midwife | Admitting: Certified Nurse Midwife

## 2019-04-15 ENCOUNTER — Other Ambulatory Visit: Payer: Self-pay

## 2019-04-15 DIAGNOSIS — O99214 Obesity complicating childbirth: Secondary | ICD-10-CM | POA: Diagnosis present

## 2019-04-15 DIAGNOSIS — D62 Acute posthemorrhagic anemia: Secondary | ICD-10-CM | POA: Diagnosis not present

## 2019-04-15 DIAGNOSIS — Z3A40 40 weeks gestation of pregnancy: Secondary | ICD-10-CM

## 2019-04-15 DIAGNOSIS — O26893 Other specified pregnancy related conditions, third trimester: Secondary | ICD-10-CM | POA: Diagnosis present

## 2019-04-15 DIAGNOSIS — Z3490 Encounter for supervision of normal pregnancy, unspecified, unspecified trimester: Secondary | ICD-10-CM | POA: Diagnosis present

## 2019-04-15 DIAGNOSIS — O9081 Anemia of the puerperium: Secondary | ICD-10-CM | POA: Diagnosis not present

## 2019-04-15 DIAGNOSIS — E282 Polycystic ovarian syndrome: Secondary | ICD-10-CM | POA: Diagnosis present

## 2019-04-15 DIAGNOSIS — O99892 Other specified diseases and conditions complicating childbirth: Secondary | ICD-10-CM | POA: Diagnosis not present

## 2019-04-15 DIAGNOSIS — O99284 Endocrine, nutritional and metabolic diseases complicating childbirth: Secondary | ICD-10-CM | POA: Diagnosis present

## 2019-04-15 DIAGNOSIS — E669 Obesity, unspecified: Secondary | ICD-10-CM | POA: Diagnosis present

## 2019-04-15 DIAGNOSIS — O480 Post-term pregnancy: Secondary | ICD-10-CM | POA: Diagnosis not present

## 2019-04-15 DIAGNOSIS — Z1159 Encounter for screening for other viral diseases: Secondary | ICD-10-CM

## 2019-04-15 DIAGNOSIS — E039 Hypothyroidism, unspecified: Secondary | ICD-10-CM | POA: Diagnosis present

## 2019-04-15 DIAGNOSIS — O0990 Supervision of high risk pregnancy, unspecified, unspecified trimester: Secondary | ICD-10-CM

## 2019-04-15 LAB — CBC
HCT: 35.8 % — ABNORMAL LOW (ref 36.0–46.0)
Hemoglobin: 11.8 g/dL — ABNORMAL LOW (ref 12.0–15.0)
MCH: 29.5 pg (ref 26.0–34.0)
MCHC: 33 g/dL (ref 30.0–36.0)
MCV: 89.5 fL (ref 80.0–100.0)
Platelets: 276 10*3/uL (ref 150–400)
RBC: 4 MIL/uL (ref 3.87–5.11)
RDW: 13.1 % (ref 11.5–15.5)
WBC: 11.9 10*3/uL — ABNORMAL HIGH (ref 4.0–10.5)
nRBC: 0 % (ref 0.0–0.2)

## 2019-04-15 LAB — TYPE AND SCREEN
ABO/RH(D): O POS
Antibody Screen: NEGATIVE

## 2019-04-15 LAB — COMPREHENSIVE METABOLIC PANEL
ALT: 13 U/L (ref 0–44)
AST: 16 U/L (ref 15–41)
Albumin: 3.4 g/dL — ABNORMAL LOW (ref 3.5–5.0)
Alkaline Phosphatase: 146 U/L — ABNORMAL HIGH (ref 38–126)
Anion gap: 12 (ref 5–15)
BUN: 10 mg/dL (ref 6–20)
CO2: 20 mmol/L — ABNORMAL LOW (ref 22–32)
Calcium: 9.5 mg/dL (ref 8.9–10.3)
Chloride: 104 mmol/L (ref 98–111)
Creatinine, Ser: 0.48 mg/dL (ref 0.44–1.00)
GFR calc Af Amer: >60 mL/min (ref >60)
GFR calc non Af Amer: >60 mL/min (ref >60)
Glucose, Bld: 92 mg/dL (ref 70–99)
Potassium: 4 mmol/L (ref 3.5–5.1)
Sodium: 136 mmol/L (ref 135–145)
Total Bilirubin: 0.3 mg/dL (ref 0.3–1.2)
Total Protein: 6.9 g/dL (ref 6.5–8.1)

## 2019-04-15 MED ORDER — OXYTOCIN 10 UNIT/ML IJ SOLN
INTRAMUSCULAR | Status: AC
  Filled 2019-04-15: qty 2

## 2019-04-15 MED ORDER — LIDOCAINE HCL (PF) 1 % IJ SOLN
INTRAMUSCULAR | Status: AC
  Filled 2019-04-15: qty 30

## 2019-04-15 MED ORDER — OXYTOCIN 40 UNITS IN NORMAL SALINE INFUSION - SIMPLE MED
2.5000 [IU]/h | INTRAVENOUS | Status: DC
  Filled 2019-04-15: qty 1000

## 2019-04-15 MED ORDER — MISOPROSTOL 25 MCG QUARTER TABLET
25.0000 ug | ORAL_TABLET | ORAL | Status: DC | PRN
  Administered 2019-04-15: 25 ug via VAGINAL
  Filled 2019-04-15 (×4): qty 1

## 2019-04-15 MED ORDER — SODIUM CHLORIDE (PF) 0.9 % IJ SOLN
INTRAMUSCULAR | Status: AC
  Administered 2019-04-15: 16:00:00
  Filled 2019-04-15: qty 50

## 2019-04-15 MED ORDER — LIDOCAINE HCL (PF) 1 % IJ SOLN: 30.0000 mL | INTRAMUSCULAR | Status: DC | PRN

## 2019-04-15 MED ORDER — BUTORPHANOL TARTRATE 1 MG/ML IJ SOLN: 1.0000 mg | INTRAMUSCULAR | Status: DC | PRN

## 2019-04-15 MED ORDER — MISOPROSTOL 200 MCG PO TABS: 800.0000 ug | ORAL_TABLET | Freq: Once | ORAL | Status: DC | PRN

## 2019-04-15 MED ORDER — MISOPROSTOL 25 MCG QUARTER TABLET
25.0000 ug | ORAL_TABLET | Freq: Once | ORAL | Status: AC
  Administered 2019-04-15: 25 ug via ORAL

## 2019-04-15 MED ORDER — LACTATED RINGERS IV SOLN
INTRAVENOUS | Status: DC
  Administered 2019-04-15 – 2019-04-16 (×4): via INTRAVENOUS

## 2019-04-15 MED ORDER — MISOPROSTOL 200 MCG PO TABS
ORAL_TABLET | ORAL | Status: AC
  Filled 2019-04-15: qty 4

## 2019-04-15 MED ORDER — AMMONIA AROMATIC IN INHA: 0.3000 mL | Freq: Once | RESPIRATORY_TRACT | Status: DC | PRN

## 2019-04-15 MED ORDER — LACTATED RINGERS IV SOLN
500.0000 mL | INTRAVENOUS | Status: DC | PRN
  Administered 2019-04-16: 500 mL via INTRAVENOUS

## 2019-04-15 MED ORDER — ONDANSETRON HCL 4 MG/2ML IJ SOLN
4.0000 mg | Freq: Four times a day (QID) | INTRAMUSCULAR | Status: DC | PRN
  Administered 2019-04-16 (×2): 4 mg via INTRAVENOUS
  Filled 2019-04-15 (×2): qty 2

## 2019-04-15 MED ORDER — OXYTOCIN BOLUS FROM INFUSION: 500.0000 mL | Freq: Once | INTRAVENOUS | Status: DC

## 2019-04-15 MED ORDER — OXYTOCIN 40 UNITS IN NORMAL SALINE INFUSION - SIMPLE MED
1.0000 m[IU]/min | INTRAVENOUS | Status: DC
  Administered 2019-04-15 – 2019-04-16 (×3): 2 m[IU]/min via INTRAVENOUS
  Administered 2019-04-16: 1000 mL via INTRAVENOUS
  Administered 2019-04-16: 4 m[IU]/min via INTRAVENOUS
  Filled 2019-04-15: qty 1000

## 2019-04-15 MED ORDER — ACETAMINOPHEN 325 MG PO TABS
650.0000 mg | ORAL_TABLET | Freq: Four times a day (QID) | ORAL | Status: DC | PRN
  Administered 2019-04-15: 650 mg via ORAL
  Filled 2019-04-15: qty 2

## 2019-04-15 MED ORDER — AMMONIA AROMATIC IN INHA
RESPIRATORY_TRACT | Status: AC
  Filled 2019-04-15: qty 10

## 2019-04-15 MED ORDER — MISOPROSTOL 25 MCG QUARTER TABLET: 25.0000 ug | ORAL_TABLET | Freq: Once | ORAL | Status: DC

## 2019-04-15 MED ORDER — TERBUTALINE SULFATE 1 MG/ML IJ SOLN: 0.2500 mg | Freq: Once | INTRAMUSCULAR | Status: DC | PRN

## 2019-04-15 NOTE — Progress Notes (Signed)
   04/15/19 1000  Clinical Encounter Type  Visited With Patient and family together  Visit Type Initial  Referral From Nurse  Consult/Referral To Chaplain  Spiritual Encounters  Spiritual Needs Prayer;Ritual;Emotional  Chaplain received OR for prayer. The patient was lying in bed she just received induction meds. Her husband Victoria Becker) was by her bedside. The patient shares her excitement of the birthing of baby Zacary. The patient and husband share their belief in [place] and knew that everything would be fine. Patient asked if the Chaplain could do baptism for the baby. Chaplain explain the process of just sprinkling and a prayer of dedication. Chaplain told patient she could let the care team know when she was ready and they would contact the [facility]. Chaplain prayed with patient and husband.

## 2019-04-15 NOTE — Progress Notes (Signed)
L&D Progress Note Now 4 hours post first dose of 25 mcg Cytotec orally and 25 mcg PV  S: Feeling some cramping.  O: BP 122/88   Pulse 92   Temp 97.8 F (36.6 C) (Oral)   Resp 18   Ht 4\' 9"  (1.448 m)   Wt 80.7 kg   LMP 07/01/2018 (Approximate)   BMI 38.52 kg/m    General: in NAD  FHR: 145 baseline with accelerations to 170s, moderate variability with occasional mild variable deceleration  Toco: contractions every 2-3 minutes apart   Cervix: 1.5/60%/-2/vertex  A: Elective IOL  P: 30 ml foley balloon inserted in cervix around 3 PM. Ambulate or birthing ball.  Consider second dose of Cytotec vs Pitocin  Victoria Becker, CNM

## 2019-04-15 NOTE — Plan of Care (Signed)
  Problem: Safety: Goal: Risk of complications during labor and delivery will decrease Outcome: Progressing   Problem: Pain Management: Goal: Relief or control of pain from uterine contractions will improve Outcome: Progressing   Problem: Education: Goal: Knowledge of Childbirth will improve Outcome: Progressing Goal: Ability to make informed decisions regarding treatment and plan of care will improve Outcome: Progressing Goal: Ability to state and carry out methods to decrease the pain will improve Outcome: Progressing Goal: Individualized Educational Video(s) Outcome: Progressing   Problem: Coping: Goal: Level of anxiety will decrease Outcome: Progressing   Problem: Education: Goal: Knowledge of General Education information will improve Description: Including pain rating scale, medication(s)/side effects and non-pharmacologic comfort measures Outcome: Progressing

## 2019-04-15 NOTE — Progress Notes (Signed)
   Subjective:  Comfortable  Objective:   Vitals: Blood pressure 119/80, pulse (!) 103, temperature 98.2 F (36.8 C), temperature source Oral, resp. rate 18, height 4\' 9"  (1.448 m), weight 80.7 kg, last menstrual period 07/01/2018. General: NAD Abdomen: Gravid non-tender Cervical Exam:  Dilation: 3.5 Effacement (%): 60 Cervical Position: Posterior Station: -3 Presentation: Vertex Exam by:: A. Onya Eutsler Still very posterior  FHT: 150, moderate, +accels, one decel note at [place]: q3-16min  Results for orders placed or performed during the hospital encounter of 04/15/19 (from the past 24 hour(s))  Type and screen [place]     Status: None   Collection Time: 04/15/19 10:05 AM  Result Value Ref Range   ABO/RH(D) O POS    Antibody Screen NEG    Sample Expiration      04/18/2019,2359 Performed at [facility], [address] [place], [place] [address]   CBC     Status: Abnormal   Collection Time: 04/15/19 10:05 AM  Result Value Ref Range   WBC 11.9 (H) 4.0 - 10.5 K/uL   RBC 4.00 3.87 - 5.11 MIL/uL   Hemoglobin 11.8 (L) 12.0 - 15.0 g/dL   HCT 35.8 (L) 36.0 - 46.0 %   MCV 89.5 80.0 - 100.0 fL   MCH 29.5 26.0 - 34.0 pg   MCHC 33.0 30.0 - 36.0 g/dL   RDW 13.1 11.5 - 15.5 %   Platelets 276 150 - 400 K/uL   nRBC 0.0 0.0 - 0.2 %  Comprehensive metabolic panel     Status: Abnormal   Collection Time: 04/15/19 10:05 AM  Result Value Ref Range   Sodium 136 135 - 145 mmol/L   Potassium 4.0 3.5 - 5.1 mmol/L   Chloride 104 98 - 111 mmol/L   CO2 20 (L) 22 - 32 mmol/L   Glucose, Bld 92 70 - 99 mg/dL   BUN 10 6 - 20 mg/dL   Creatinine, Ser 0.48 0.44 - 1.00 mg/dL   Calcium 9.5 8.9 - 10.3 mg/dL   Total Protein 6.9 6.5 - 8.1 g/dL   Albumin 3.4 (L) 3.5 - 5.0 g/dL   AST 16 15 - 41 U/L   ALT 13 0 - 44 U/L   Alkaline Phosphatase 146 (H) 38 - 126 U/L   Total Bilirubin 0.3 0.3 - 1.2 mg/dL   GFR calc non Af Amer >60 >60 mL/min   GFR calc Af Amer >60  >60 mL/min   Anion gap 12 5 - 15    Assessment:   23 y.o. G1P0000 [redacted]w[redacted]d elective IOL  Plan:   1) Labor - continue to titrate pitocin, cervix to posterior to allow for rupture of membranes  2) Fetus - cat I tracing  Malachy Mood, MD, [place], [facility] 04/15/2019, 8:53 PM

## 2019-04-15 NOTE — H&P (Signed)
OB History & Physical   History of Present Illness:  Chief Complaint:   HPI:  Talbert CageLauren Becker is a 23 y.o. 341P0000 female with EDC=04/13/2019 at 5341w2d dated by a 7 week ultrasound.  Her pregnancy has been complicated by hyperemesis, hypothyroidism due to Hashimoto's (currently on levothyroxine 125 mcg daily M-Sat, and 250 mcg on Sunday), obesity (current BMI 38.75 kg/m2)  and PCOS with some degree of glucose intolerance( is on Metformin).  She presents to L&D for admission for elective induction of labor.  Prenatal care site: [facility] has been remarkable for a 15 # weight gain and the following  [facility]inic [facility] Prenatal Labs  Dating Ultrasound at 7w Blood type: O/Positive/-- (10/15 1622)   Genetic Screen NIPS: Negative XY Antibody:Negative (10/15 1622)  Anatomic US Complete and normal 1/22 Rubella: 2.14 (10/15 1622) Varicella: Immune  GTT Early: 118             Third trimester: 197    3 hour: 80/202/134/124 RPR: Non Reactive (10/15 1622)   Rhogam N/A HBsAg: Negative (10/15 1622)   TDaP vaccine 02/03/2019                 Flu Shot: HIV: Non Reactive (10/15 1622)   Baby Food Breast                               GBS: Negative  Contraception Undecided, possibly POP or Depo. Give booklet at next visit [x]  Pap: NIL/ negative GC and Chlamydia  CBB     CS/VBAC    Support Person            Maternal Medical History:   Past Medical History:  Diagnosis Date  . Amenorrhea   . Chronic pelvic pain in female   . Dyspareunia   . Dysphagia   . Endometriosis   . Enlarged thyroid   . Follicular cyst of ovary   . Heart murmur   . Heavy periods   . Hypothyroidism    HASHIMOTO'S  . IBS (irritable bowel syndrome)   . Kidney stone   . PCOS (polycystic ovarian syndrome)   . Pelvic pain in female   . Raynaud disease   . Syncope   . UTI (lower urinary tract infection)     Past Surgical History:  Procedure Laterality Date  . APPENDECTOMY  10/2013  . CHOLECYSTECTOMY    . COLON  SURGERY  08/2013  . WISDOM TOOTH EXTRACTION  2012/2013   ONE    Allergies  Allergen Reactions  . Amoxicillin-Pot Clavulanate Nausea And Vomiting and Other (See Comments)    Projectile Vomiting vomitting Projectile Vomiting   . Hydromorphone Other (See Comments)    Migraine headache Migraine headache   . Latex Rash    Prior to Admission medications   Medication Sig Start Date End Date Taking? Authorizing Provider  famotidine (PEPCID) 20 MG tablet Take 1 tablet (20 mg total) by mouth 2 (two) times daily. 04/11/19   Farrel ConnersGutierrez, Ardel Jagger, CNM  levothyroxine (SYNTHROID, LEVOTHROID) 125 MCG tablet Take one tablet Monday-Saturday and take two tablets on Sunday 12/22/18   Farrel ConnersGutierrez, Kathrina Crosley, CNM  metFORMIN (GLUCOPHAGE-XR) 500 MG 24 hr tablet Take 1 tablet by mouth daily. 04/05/18 04/05/19  [provider]  metFORMIN (GLUCOPHAGE-XR) 500 MG 24 hr tablet Take by mouth. 04/05/18   [provider]  ondansetron (ZOFRAN ODT) 4 MG disintegrating tablet Take 1 tablet (4 mg total) by mouth every  6 (six) hours as needed for nausea. 09/20/18   Rod Can, CNM  Prenatal Vit-Fe Fumarate-FA (MULTIVITAMIN-PRENATAL) 27-0.8 MG TABS tablet Take 1 tablet by mouth daily at 12 noon.    [provider]          Social History: She  reports that she has never smoked. She has never used smokeless tobacco. She reports that she does not drink alcohol or use drugs.  Family History: family history includes Cancer in her paternal grandfather; Cancer (age of onset: 12) in her paternal aunt; Diabetes in her maternal grandmother, mother, paternal grandmother, and sister; Heart disease in her maternal grandmother and paternal grandmother; Hypertension in her maternal grandmother, mother, and sister; Stroke in her maternal grandmother and paternal grandmother.   Review of Systems: Negative x 10 systems reviewed except as noted in the HPI.      Physical Exam:  Vital Signs:BP (!) 124/91 (BP  Location: Left Arm)   Pulse 94   Temp 98.2 F (36.8 C) (Oral)   Resp 18   Ht 4\' 9"  (1.448 m)   Wt 80.7 kg   LMP 07/01/2018 (Approximate)   BMI 38.52 kg/m   General: gravid WF in no acute distress, but appears anxious HEENT: normocephalic, atraumatic Heart: regular rate & rhythm.  No murmurs/rubs/gallops Lungs: clear to auscultation bilaterally Abdomen: soft, gravid, non-tender;  EFW: 8 1/2# Pelvic:   External: Normal external female genitalia  Cervix: 1/50-60%/-2 to -3/mid/soft  Extremities: non-tender, symmetric, no edema bilaterally.  DTRs: +1  Neurologic: Alert & oriented x 3.    Baseline FHR: 145 baseline with accelerations to 180s, moderate variability. Possible early decelerations. Toco: contractions every 3-6+ minutes apart, mild     Assessment:  Victoria Becker is a 23 y.o. G1P0000 female at [redacted]w[redacted]d for elective IOL GBS negative Bishop score 6-7  Plan:  1. Admit to Labor & Delivery 2. CBC, T&S, Clrs, IVF, CMP 3. GBS negative 4. Consents obtained. 5. Discussed with patient induction process previously, reviewing methods and risks including hyperstimulation, fetal intolerance, FTP, Cesarean section 6. Plan induction with cytotec and add foley bulb with next check 7. O POS/RI/VI 8. TDAP given AP 9. Breast  Dalia Heading  04/15/2019 8:08 AM

## 2019-04-15 NOTE — Progress Notes (Signed)
L&D Progress Note   S: Felt good in the shower-really helped the contraction pain. Since foley bulb fell out, contraction pain much less.  O: BP 119/80   Pulse (!) 103   Temp 98.2 F (36.8 C) (Oral)   Resp 18   Ht 4\' 9"  (1.448 m)   Wt 80.7 kg   LMP 07/01/2018 (Approximate)   BMI 38.52 kg/m    FHR: 150s baseline, accelerations to 180, moderate variability. Toco: contractions 1-3 min apart prior to foley bulb falling out  Cervix: 3.5/60%/-1/ on left  A: cervical dilation with foley bulb  P: Pitocin augmentation Epidural when patient desires  Dalia Heading, CNM

## 2019-04-16 ENCOUNTER — Inpatient Hospital Stay: Payer: BC Managed Care – PPO

## 2019-04-16 ENCOUNTER — Encounter
Admission: EM | Disposition: A | Discharge status: Home / Self Care | Payer: Self-pay | Source: Home / Self Care | Attending: Certified Nurse Midwife

## 2019-04-16 ENCOUNTER — Inpatient Hospital Stay: Payer: BC Managed Care – PPO | Admitting: Anesthesiology

## 2019-04-16 ENCOUNTER — Encounter: Payer: Self-pay | Admitting: Anesthesiology

## 2019-04-16 SURGERY — Surgical Case
Anesthesia: Epidural

## 2019-04-16 MED ORDER — OXYCODONE HCL 5 MG PO TABS: 5.0000 mg | ORAL_TABLET | Freq: Once | ORAL | Status: DC | PRN

## 2019-04-16 MED ORDER — KETOROLAC TROMETHAMINE 30 MG/ML IJ SOLN
INTRAMUSCULAR | Status: DC | PRN
  Administered 2019-04-16: 30 mg via INTRAVENOUS

## 2019-04-16 MED ORDER — LACTATED RINGERS IV SOLN
500.0000 mL | Freq: Once | INTRAVENOUS | Status: AC
  Administered 2019-04-16: 500 mL via INTRAVENOUS

## 2019-04-16 MED ORDER — NALOXONE HCL 0.4 MG/ML IJ SOLN: 0.4000 mg | INTRAMUSCULAR | Status: DC | PRN

## 2019-04-16 MED ORDER — DIPHENHYDRAMINE HCL 50 MG/ML IJ SOLN: 12.5000 mg | INTRAMUSCULAR | Status: DC | PRN

## 2019-04-16 MED ORDER — PHENYLEPHRINE 40 MCG/ML (10ML) SYRINGE FOR IV PUSH (FOR BLOOD PRESSURE SUPPORT): 80.0000 ug | PREFILLED_SYRINGE | INTRAVENOUS | Status: DC | PRN

## 2019-04-16 MED ORDER — FENTANYL CITRATE (PF) 100 MCG/2ML IJ SOLN
INTRAMUSCULAR | Status: AC
  Filled 2019-04-16: qty 2

## 2019-04-16 MED ORDER — ENOXAPARIN SODIUM 40 MG/0.4ML ~~LOC~~ SOLN
40.0000 mg | SUBCUTANEOUS | Status: DC
  Administered 2019-04-17 – 2019-04-18 (×2): 40 mg via SUBCUTANEOUS
  Filled 2019-04-16 (×3): qty 0.4

## 2019-04-16 MED ORDER — MORPHINE SULFATE (PF) 0.5 MG/ML IJ SOLN
INTRAMUSCULAR | Status: AC
  Filled 2019-04-16: qty 10

## 2019-04-16 MED ORDER — OXYCODONE-ACETAMINOPHEN 5-325 MG PO TABS
1.0000 | ORAL_TABLET | ORAL | Status: DC | PRN
  Filled 2019-04-16: qty 1

## 2019-04-16 MED ORDER — FENTANYL 2.5 MCG/ML W/ROPIVACAINE 0.15% IN NS 100 ML EPIDURAL (ARMC)
EPIDURAL | Status: AC
  Filled 2019-04-16: qty 100

## 2019-04-16 MED ORDER — DIPHENHYDRAMINE HCL 25 MG PO CAPS: 25.0000 mg | ORAL_CAPSULE | ORAL | Status: DC | PRN

## 2019-04-16 MED ORDER — FENTANYL 2.5 MCG/ML W/ROPIVACAINE 0.15% IN NS 100 ML EPIDURAL (ARMC)
12.0000 mL/h | EPIDURAL | Status: DC
  Administered 2019-04-16 (×2): 12 mL/h via EPIDURAL
  Filled 2019-04-16: qty 100

## 2019-04-16 MED ORDER — NALBUPHINE HCL 10 MG/ML IJ SOLN: 5.0000 mg | INTRAMUSCULAR | Status: DC | PRN

## 2019-04-16 MED ORDER — FENTANYL CITRATE (PF) 100 MCG/2ML IJ SOLN
25.0000 ug | INTRAMUSCULAR | Status: DC | PRN
  Administered 2019-04-16 (×3): 50 ug via INTRAVENOUS
  Filled 2019-04-16: qty 2

## 2019-04-16 MED ORDER — IBUPROFEN 800 MG PO TABS
800.0000 mg | ORAL_TABLET | Freq: Three times a day (TID) | ORAL | Status: DC
  Administered 2019-04-17 – 2019-04-19 (×6): 800 mg via ORAL
  Filled 2019-04-16 (×6): qty 1

## 2019-04-16 MED ORDER — SODIUM BICARBONATE 8.4 % IV SOLN
INTRAVENOUS | Status: AC
  Filled 2019-04-16: qty 50

## 2019-04-16 MED ORDER — SODIUM CHLORIDE 0.9 % IV SOLN
500.0000 mg | Freq: Once | INTRAVENOUS | Status: AC
  Administered 2019-04-16: 500 mg via INTRAVENOUS
  Filled 2019-04-16: qty 500

## 2019-04-16 MED ORDER — SODIUM CHLORIDE 0.9% FLUSH: 3.0000 mL | INTRAVENOUS | Status: DC | PRN

## 2019-04-16 MED ORDER — PHENYLEPHRINE 40 MCG/ML (10ML) SYRINGE FOR IV PUSH (FOR BLOOD PRESSURE SUPPORT)
PREFILLED_SYRINGE | INTRAVENOUS | Status: DC | PRN
  Administered 2019-04-16 (×5): 300 ug via INTRAVENOUS

## 2019-04-16 MED ORDER — OXYTOCIN 40 UNITS IN NORMAL SALINE INFUSION - SIMPLE MED
2.5000 [IU]/h | INTRAVENOUS | Status: AC
  Filled 2019-04-16: qty 1000

## 2019-04-16 MED ORDER — WITCH HAZEL-GLYCERIN EX PADS: 1.0000 "application " | MEDICATED_PAD | CUTANEOUS | Status: DC | PRN

## 2019-04-16 MED ORDER — PRENATAL MULTIVITAMIN CH
1.0000 | ORAL_TABLET | Freq: Every day | ORAL | Status: DC
  Administered 2019-04-17 – 2019-04-19 (×3): 1 via ORAL
  Filled 2019-04-16 (×3): qty 1

## 2019-04-16 MED ORDER — BUPIVACAINE 0.25 % ON-Q PUMP DUAL CATH 400 ML
400.0000 mL | INJECTION | Status: DC
  Filled 2019-04-16: qty 400

## 2019-04-16 MED ORDER — LIDOCAINE HCL (PF) 2 % IJ SOLN
INTRAMUSCULAR | Status: DC | PRN
  Administered 2019-04-16 (×2): 100 mg via EPIDURAL
  Administered 2019-04-16: 20 mg via INTRADERMAL
  Administered 2019-04-16 (×2): 60 mg via INTRADERMAL

## 2019-04-16 MED ORDER — MEPERIDINE HCL 25 MG/ML IJ SOLN: 6.2500 mg | INTRAMUSCULAR | Status: DC | PRN

## 2019-04-16 MED ORDER — LIDOCAINE-EPINEPHRINE (PF) 1.5 %-1:200000 IJ SOLN
INTRAMUSCULAR | Status: DC | PRN
  Administered 2019-04-16: 3 mL via EPIDURAL
  Administered 2019-04-16: 3 mL via PERINEURAL

## 2019-04-16 MED ORDER — LIDOCAINE HCL (PF) 1 % IJ SOLN
INTRAMUSCULAR | Status: DC | PRN
  Administered 2019-04-16 (×2): 1 mL via INTRADERMAL

## 2019-04-16 MED ORDER — FENTANYL CITRATE (PF) 100 MCG/2ML IJ SOLN
INTRAMUSCULAR | Status: DC | PRN
  Administered 2019-04-16 (×2): 50 ug via INTRAVENOUS
  Administered 2019-04-16: 100 ug via EPIDURAL

## 2019-04-16 MED ORDER — SIMETHICONE 80 MG PO CHEW: 80.0000 mg | CHEWABLE_TABLET | ORAL | Status: DC | PRN

## 2019-04-16 MED ORDER — SOD CITRATE-CITRIC ACID 500-334 MG/5ML PO SOLN
30.0000 mL | ORAL | Status: AC
  Administered 2019-04-16: 30 mL via ORAL

## 2019-04-16 MED ORDER — BUPIVACAINE HCL (PF) 0.5 % IJ SOLN
INTRAMUSCULAR | Status: AC
  Filled 2019-04-16: qty 30

## 2019-04-16 MED ORDER — PROMETHAZINE HCL 25 MG/ML IJ SOLN
INTRAMUSCULAR | Status: DC | PRN
  Administered 2019-04-16: 6.25 mg via INTRAVENOUS

## 2019-04-16 MED ORDER — ONDANSETRON HCL 4 MG/2ML IJ SOLN: 4.0000 mg | Freq: Three times a day (TID) | INTRAMUSCULAR | Status: DC | PRN

## 2019-04-16 MED ORDER — LEVOTHYROXINE SODIUM 125 MCG PO TABS
125.0000 ug | ORAL_TABLET | Freq: Every day | ORAL | Status: DC
  Administered 2019-04-16: 125 ug via ORAL
  Filled 2019-04-16 (×2): qty 1

## 2019-04-16 MED ORDER — SODIUM CHLORIDE 0.9 % IV SOLN
INTRAVENOUS | Status: DC | PRN
  Administered 2019-04-16 (×4): 5 mL via EPIDURAL

## 2019-04-16 MED ORDER — MENTHOL 3 MG MT LOZG
1.0000 | LOZENGE | OROMUCOSAL | Status: DC | PRN
  Filled 2019-04-16: qty 9

## 2019-04-16 MED ORDER — COCONUT OIL OIL
1.0000 "application " | TOPICAL_OIL | Status: DC | PRN
  Administered 2019-04-18: 1 via TOPICAL
  Filled 2019-04-16 (×2): qty 120

## 2019-04-16 MED ORDER — SIMETHICONE 80 MG PO CHEW
80.0000 mg | CHEWABLE_TABLET | ORAL | Status: DC
  Administered 2019-04-17 – 2019-04-18 (×4): 80 mg via ORAL
  Filled 2019-04-16 (×3): qty 1

## 2019-04-16 MED ORDER — NALBUPHINE HCL 10 MG/ML IJ SOLN: 5.0000 mg | Freq: Once | INTRAMUSCULAR | Status: DC | PRN

## 2019-04-16 MED ORDER — EPHEDRINE SULFATE 50 MG/ML IJ SOLN
INTRAMUSCULAR | Status: AC
  Filled 2019-04-16: qty 1

## 2019-04-16 MED ORDER — LIDOCAINE HCL (PF) 2 % IJ SOLN
INTRAMUSCULAR | Status: AC
  Filled 2019-04-16: qty 40

## 2019-04-16 MED ORDER — ACETAMINOPHEN 325 MG PO TABS
650.0000 mg | ORAL_TABLET | Freq: Four times a day (QID) | ORAL | Status: AC
  Administered 2019-04-17 (×4): 650 mg via ORAL
  Filled 2019-04-16 (×4): qty 2

## 2019-04-16 MED ORDER — OXYCODONE HCL 5 MG/5ML PO SOLN: 5.0000 mg | Freq: Once | ORAL | Status: DC | PRN

## 2019-04-16 MED ORDER — KETOROLAC TROMETHAMINE 30 MG/ML IJ SOLN
INTRAMUSCULAR | Status: AC
  Filled 2019-04-16: qty 1

## 2019-04-16 MED ORDER — LACTATED RINGERS IV SOLN: INTRAVENOUS | Status: DC

## 2019-04-16 MED ORDER — CEFAZOLIN SODIUM-DEXTROSE 2-4 GM/100ML-% IV SOLN
2.0000 g | INTRAVENOUS | Status: AC
  Administered 2019-04-16: 2 g via INTRAVENOUS
  Filled 2019-04-16: qty 100

## 2019-04-16 MED ORDER — EPHEDRINE SULFATE 50 MG/ML IJ SOLN
INTRAMUSCULAR | Status: DC | PRN
  Administered 2019-04-16: 15 mg via INTRAVENOUS
  Administered 2019-04-16: 25 mg via INTRAVENOUS

## 2019-04-16 MED ORDER — DIPHENHYDRAMINE HCL 25 MG PO CAPS: 25.0000 mg | ORAL_CAPSULE | Freq: Four times a day (QID) | ORAL | Status: DC | PRN

## 2019-04-16 MED ORDER — PROMETHAZINE HCL 25 MG/ML IJ SOLN
INTRAMUSCULAR | Status: AC
  Filled 2019-04-16: qty 1

## 2019-04-16 MED ORDER — EPHEDRINE 5 MG/ML INJ: 10.0000 mg | INTRAVENOUS | Status: DC | PRN

## 2019-04-16 MED ORDER — MORPHINE SULFATE (PF) 0.5 MG/ML IJ SOLN
INTRAMUSCULAR | Status: DC | PRN
  Administered 2019-04-16: 3 mg via EPIDURAL
  Administered 2019-04-16: 2 mg via INTRAVENOUS

## 2019-04-16 MED ORDER — FENTANYL CITRATE (PF) 100 MCG/2ML IJ SOLN
INTRAMUSCULAR | Status: AC
  Administered 2019-04-16: 50 ug via INTRAVENOUS
  Filled 2019-04-16: qty 2

## 2019-04-16 MED ORDER — SIMETHICONE 80 MG PO CHEW
80.0000 mg | CHEWABLE_TABLET | Freq: Three times a day (TID) | ORAL | Status: DC
  Administered 2019-04-17 – 2019-04-19 (×7): 80 mg via ORAL
  Filled 2019-04-16 (×8): qty 1

## 2019-04-16 MED ORDER — KETOROLAC TROMETHAMINE 30 MG/ML IJ SOLN
30.0000 mg | Freq: Four times a day (QID) | INTRAMUSCULAR | Status: AC
  Administered 2019-04-16 – 2019-04-17 (×4): 30 mg via INTRAVENOUS
  Filled 2019-04-16 (×4): qty 1

## 2019-04-16 MED ORDER — KETOROLAC TROMETHAMINE 30 MG/ML IJ SOLN: 30.0000 mg | Freq: Four times a day (QID) | INTRAMUSCULAR | Status: AC

## 2019-04-16 MED ORDER — BUPIVACAINE HCL 0.5 % IJ SOLN
20.0000 mL | INTRAMUSCULAR | Status: DC
  Filled 2019-04-16: qty 20

## 2019-04-16 MED ORDER — SOD CITRATE-CITRIC ACID 500-334 MG/5ML PO SOLN
ORAL | Status: AC
  Administered 2019-04-16: 20:00:00 30 mL via ORAL
  Filled 2019-04-16: qty 15

## 2019-04-16 MED ORDER — SENNOSIDES-DOCUSATE SODIUM 8.6-50 MG PO TABS
2.0000 | ORAL_TABLET | ORAL | Status: DC
  Administered 2019-04-17 – 2019-04-18 (×3): 2 via ORAL
  Filled 2019-04-16 (×3): qty 2

## 2019-04-16 MED ORDER — OXYCODONE HCL 5 MG PO TABS: 5.0000 mg | ORAL_TABLET | Freq: Four times a day (QID) | ORAL | Status: AC | PRN

## 2019-04-16 MED ORDER — DIBUCAINE (PERIANAL) 1 % EX OINT: 1.0000 "application " | TOPICAL_OINTMENT | CUTANEOUS | Status: DC | PRN

## 2019-04-16 MED ORDER — ZOLPIDEM TARTRATE 5 MG PO TABS: 5.0000 mg | ORAL_TABLET | Freq: Every evening | ORAL | Status: DC | PRN

## 2019-04-16 SURGICAL SUPPLY — 27 items
BAG COUNTER SPONGE EZ (MISCELLANEOUS) ×2 IMPLANT
CANISTER SUCT 3000ML PPV (MISCELLANEOUS) ×2 IMPLANT
CATH KIT ON-Q SILVERSOAK 5IN (CATHETERS) ×4 IMPLANT
CHLORAPREP W/TINT 26 (MISCELLANEOUS) ×4 IMPLANT
DERMABOND ADVANCED (GAUZE/BANDAGES/DRESSINGS) ×1
DERMABOND ADVANCED .7 DNX12 (GAUZE/BANDAGES/DRESSINGS) ×1 IMPLANT
DRSG OPSITE POSTOP 4X10 (GAUZE/BANDAGES/DRESSINGS) ×2 IMPLANT
DRSG TELFA 3X8 NADH (GAUZE/BANDAGES/DRESSINGS) ×2 IMPLANT
ELECT CAUTERY BLADE 6.4 (BLADE) ×2 IMPLANT
ELECT REM PT RETURN 9FT ADLT (ELECTROSURGICAL) ×2
ELECTRODE REM PT RTRN 9FT ADLT (ELECTROSURGICAL) ×1 IMPLANT
GAUZE SPONGE 4X4 12PLY STRL (GAUZE/BANDAGES/DRESSINGS) ×2 IMPLANT
GLOVE BIO SURGEON STRL SZ7 (GLOVE) ×2 IMPLANT
GLOVE INDICATOR 7.5 STRL GRN (GLOVE) ×2 IMPLANT
GOWN STRL REUS W/ TWL LRG LVL3 (GOWN DISPOSABLE) ×3 IMPLANT
GOWN STRL REUS W/TWL LRG LVL3 (GOWN DISPOSABLE) ×3
NS IRRIG 1000ML POUR BTL (IV SOLUTION) ×2 IMPLANT
PACK C SECTION AR (MISCELLANEOUS) ×2 IMPLANT
PAD OB MATERNITY 4.3X12.25 (PERSONAL CARE ITEMS) ×2 IMPLANT
PAD PREP 24X41 OB/GYN DISP (PERSONAL CARE ITEMS) ×2 IMPLANT
PENCIL SMOKE ULTRAEVAC 22 CON (MISCELLANEOUS) ×2 IMPLANT
STRIP CLOSURE SKIN 1/2X4 (GAUZE/BANDAGES/DRESSINGS) ×2 IMPLANT
SUT MNCRL AB 4-0 PS2 18 (SUTURE) ×2 IMPLANT
SUT PDS AB 1 TP1 96 (SUTURE) ×4 IMPLANT
SUT VIC AB 0 CTX 36 (SUTURE) ×2
SUT VIC AB 0 CTX36XBRD ANBCTRL (SUTURE) ×2 IMPLANT
SUT VIC AB 2-0 CT1 36 (SUTURE) ×2 IMPLANT

## 2019-04-16 NOTE — Discharge Summary (Signed)
Obstetric Discharge Summary Reason for Admission: induction of labor Prenatal Procedures: none Intrapartum Procedures: cesarean: low cervical, transverse Postpartum Procedures: none Complications-Operative and Postpartum: none Hemoglobin  Date Value Ref Range Status  04/17/2019 9.3 (L) 12.0 - 15.0 g/dL Final  01/19/2019 12.4 11.1 - 15.9 g/dL Final   HCT  Date Value Ref Range Status  04/17/2019 29.1 (L) 36.0 - 46.0 % Final   Hematocrit  Date Value Ref Range Status  01/19/2019 35.5 34.0 - 46.6 % Final    Physical Exam:  General: alert, cooperative and no distress . Passing flatus. Has had several BMs. Heart: RRR without murmur Lochia: appropriate Abdomen: soft, non distended, bowel sounds active. Uterine Fundus: firm/ U-1/ML/NT Incision: Honey comb dressing intact, ON Q intact DVT Evaluation: No evidence of DVT seen on physical exam.  Discharge Diagnoses: Term Pregnancy-delivered and fetal intolerance to labor  Discharge Information: Date: 04/19/2019 Activity: pelvic rest, no heavy lifting>10# until 6 weeks postpartum Diet: routine Allergies as of 04/19/2019      Reactions   Amoxicillin-pot Clavulanate Nausea And Vomiting, Other (See Comments)   Projectile Vomiting vomitting Projectile Vomiting   Hydromorphone Other (See Comments)   Migraine headache Migraine headache   Latex Rash      Medication List    STOP taking these medications   metFORMIN 500 MG 24 hr tablet Commonly known as: GLUCOPHAGE-XR   ondansetron 4 MG disintegrating tablet Commonly known as: Zofran ODT     TAKE these medications   acetaminophen 500 MG tablet Commonly known as: TYLENOL Take 2 tablets (1,000 mg total) by mouth every 6 (six) hours as needed for fever or headache.   famotidine 20 MG tablet Commonly known as: PEPCID Take 1 tablet (20 mg total) by mouth 2 (two) times daily.   ferrous sulfate 325 (65 FE) MG tablet Take 1 tablet (325 mg total) by mouth daily with lunch.    ibuprofen 600 MG tablet Commonly known as: ADVIL Take 1 tablet (600 mg total) by mouth every 6 (six) hours as needed.   levothyroxine 125 MCG tablet Commonly known as: SYNTHROID Take 1 tablet (125 mcg total) by mouth daily at 6 (six) AM. Start taking on: April 20, 2019 What changed:   how much to take  how to take this  when to take this  additional instructions   multivitamin-prenatal 27-0.8 MG Tabs tablet Take 1 tablet by mouth daily at 12 noon.   norethindrone 0.35 MG tablet Commonly known as: MICRONOR Take 1 tablet (0.35 mg total) by mouth daily. Start taking on: May 15, 2019   oxyCODONE 5 MG immediate release tablet Commonly known as: Oxy IR/ROXICODONE Take 1 tablet (5 mg total) by mouth every 6 (six) hours as needed for up to 5 days for severe pain.       Condition: stable Discharge to: home Follow-up Information    Malachy Mood, MD. Go on 04/26/2019.   Specialty: Obstetrics and Gynecology Why: 1 week wound recheck/follow-up: June 23rd at 11:30am Contact information: [address] [place] [address] [phone]           Newborn Data: Live born female [name] Birth Weight: 8 lb 0.8 oz (3650 g) APGAR: 4, 8  Newborn Delivery   Birth date/time: 04/16/2019 19:47:00 Delivery type: C-Section, Low Transverse Trial of labor: No C-section categorization: Primary      Home with mother.  Dalia Heading, CNM

## 2019-04-16 NOTE — Anesthesia Post-op Follow-up Note (Signed)
Anesthesia QCDR form completed.        

## 2019-04-16 NOTE — Anesthesia Procedure Notes (Signed)
Epidural Patient location during procedure: OB Start time: 04/16/2019 5:50 PM End time: 04/16/2019 5:53 PM  Staffing Anesthesiologist: Piscitello, Precious Haws, MD Performed: anesthesiologist   Preanesthetic Checklist Completed: patient identified, site marked, surgical consent, pre-op evaluation, timeout performed, IV checked, risks and benefits discussed and monitors and equipment checked  Epidural Patient position: sitting Prep: ChloraPrep Patient monitoring: heart rate, continuous pulse ox and blood pressure Approach: midline Location: L3-L4 Injection technique: LOR saline  Needle:  Needle type: Tuohy  Needle gauge: 17 G Needle length: 9 cm and 9 Needle insertion depth: 6.5 cm Catheter type: closed end flexible Catheter size: 19 Gauge Catheter at skin depth: 12 cm Test dose: negative and 1.5% lidocaine with Epi 1:200 K  Assessment Sensory level: T10 Events: blood not aspirated, injection not painful, no injection resistance, negative IV test and no paresthesia  Additional Notes Replacement epidural note 1 attempt Pt. Evaluated and documentation done after procedure finished. Patient identified. Risks/Benefits/Options discussed with patient including but not limited to bleeding, infection, nerve damage, paralysis, failed block, incomplete pain control, headache, blood pressure changes, nausea, vomiting, reactions to medication both or allergic, itching and postpartum back pain. Confirmed with bedside nurse the patient's most recent platelet count. Confirmed with patient that they are not currently taking any anticoagulation, have any bleeding history or any family history of bleeding disorders. Patient expressed understanding and wished to proceed. All questions were answered. Sterile technique was used throughout the entire procedure. Please see nursing notes for vital signs. Test dose was given through epidural catheter and negative prior to continuing to dose epidural or start  infusion. Warning signs of high block given to the patient including shortness of breath, tingling/numbness in hands, complete motor block, or any concerning symptoms with instructions to call for help. Patient was given instructions on fall risk and not to get out of bed. All questions and concerns addressed with instructions to call with any issues or inadequate analgesia.   Patient tolerated the insertion well without immediate complications.Reason for block:procedure for pain

## 2019-04-16 NOTE — Transfer of Care (Signed)
Immediate Anesthesia Transfer of Care Note  Patient: Victoria Becker  Procedure(s) Performed: CESAREAN SECTION (N/A )  Patient Location:  L&D  Anesthesia Type:Epidural  Level of Consciousness: awake, alert  and oriented  Airway & Oxygen Therapy: Patient Spontanous Breathing  Post-op Assessment: Report given to RN and Post -op Vital signs reviewed and stable  Post vital signs: Reviewed and stable  Last Vitals:  Vitals Value Taken Time  BP    Temp    Pulse    Resp    SpO2      Last Pain:  Vitals:   04/16/19 1834  TempSrc: Oral  PainSc:       Patients Stated Pain Goal: 5 (35/57/32 2025)  Complications: No apparent anesthesia complications

## 2019-04-16 NOTE — Op Note (Signed)
Preoperative Diagnosis: 1) 23 y.o. G1P0000 at 4062w3d 2) Fetal intolerance to labor  Postoperative Diagnosis: 1) 23 y.o. G1P0000 at 8062w3d 2) Fetal intolerance to labor  Operation Performed: Primary low transverse C-section via pfannenstiel skin incision  Indication: Recurrent late deceleration failing to improve to position changes, amnioinfusion and cessation of pitocin  Anesthesia: Epidural  Primary Surgeon: Vena AustriaAndreas Lace Chenevert, MD  Assistant: Farrel Connersolleen Gutierrez, CNM  Preoperative Antibiotics: 2g ancef and 500mg  of azithromycin  Estimated Blood Loss: 700 mL  IV Fluids: 1:  Urine Output:: 75mL  Drains or Tubes: Foley to gravity drainage, ON-Q catheter system  Implants: none  Specimens Removed: none  Complications: none  Intraoperative Findings:  Normal tubes ovaries and uterus.  Delivery resulted in the birth of a liveborn  female, APGAR (1 MIN): 4   APGAR (5 MINS): 8, weight 8lbs 1oz.  No nuchals but there was a loop of cord trapped between the pubic symphysis and fetal scalp  Patient Condition: stable  Procedure in Detail:  Patient was taken to the operating room were she was administered regional anesthesia.  She was positioned in the supine position, prepped and draped in the  Usual sterile fashion.  Prior to proceeding with the case a time out was performed and the level of anesthetic was checked and noted to be adequate.  Utilizing the scalpel a pfannenstiel skin incision was made 2cm above the pubic symphysis and carried down sharply to the the level of the rectus fascia.  The fascia was incised in the midline using the scalpel and then extended using mayo scissors.  The superior border of the rectus fascia was grasped with two Kocher clamps and the underlying rectus muscles were dissected of the fascia using blunt dissection.  The median raphae was incised using Mayo scissors.   The inferior border of the rectus fascia was dissected of the rectus muscles in a similar  fashion.  The midline was identified, the peritoneum was entered bluntly and expanded using manual tractions.  The uterus was noted to be in a none rotated position.  Next the bladder blade was placed retracting the bladder caudally.  A bladder flap was not created.  A low transverse incision was scored on the lower uterine segment.  The hysterotomy was entered bluntly using the operators finger.  The hysterotomy incision was extended using manual traction.  The operators hand was placed within the hysterotomy position noting the fetus to be within the ROA position.  The vertex was grasped, flexed, brought to the incision, and delivered a traumatically using fundal pressure.  The remainder of the body delivered with ease.  The infant was suctioned, cord was clamped and cut before handing off to the awaiting neonatologist.  The placenta was delivered using manual extraction.  The uterus was exteriorized, wiped clean of clots and debris using two moist laps.  The hysterotomy was closed using a two layer closure of 0 Vicryl, with the first being a running locked, the second a vertical imbricating.  The uterus was returned to the abdomen.  The peritoneal gutters were wiped clean of clots and debris using two moist laps.  The hysterotomy incision was re-inspected noted to be hemostatic.  The rectus muscles were re-approximated in the midline using a single 2-0 Vicryl mattress stitch.  The rectus muscles were inspected noted to be hemostatic.  The superior border of the rectus fascia was grasped with a Kocher clamp.  The ON-Q trocars were then placed 4cm above the superior border of the incision  and tunneled subfascially.  The introducers were removed and the catheters were threaded through the sleeves after which the sleeves were removed.  The fascia was closed using a looped #1 PDS in a running fashion taking 1cm by 1cm bites.  The subcutaneous tissue was irrigated using warm saline, hemostasis achieved using the  bovie.  The subcutaneous dead space was greater than 3cm and was closed.  The subcutaneous dead space was obliterated by using a 53-T 0 Chromic in a running fashion.  The skin was closed using Insorb staples.  Sponge needle and instrument counts were corrects times two.  The patient tolerated the procedure well and was taken to the recovery room in stable condition.

## 2019-04-16 NOTE — Progress Notes (Signed)
   Subjective:  Comfortable after epidural in place  Objective:   Vitals: Blood pressure 103/68, pulse 99, temperature 98.2 F (36.8 C), temperature source Oral, resp. rate 16, height 4\' 9"  (1.448 m), weight 80.7 kg, last menstrual period 07/01/2018, SpO2 100 %. General: NAD Abdomen:gravid, non-tender Cervical Exam:  Dilation: Lip/rim Effacement (%): 100 Cervical Position: Anterior Station: Plus 1 Presentation: Vertex Exam by:: Danise Mina, CNM  FHT: 125, moderate, no accels, recurrent late decelerations which have been unresponsive to position changes, amnioinfusion, or stopping pitocin Toco:q45min  No results found for this or any previous visit (from the past 24 hour(s)).  Assessment:   23 y.o. G1P0000 [redacted]w[redacted]d   Plan:   1) The patient was counseled regarding risk and benefits to proceeding with Cesarean section to expedite delivery.  Risk of cesarean section were discussed including risk of bleeding and need for potential intraoperative or postoperative blood transfusion with a rate of approximately 5% quoted for all Cesarean sections, risk of injury to adjacent organs including but not limited to bowl and bladder, the need for additional surgical procedures to address such injuries, and the risk of infection.  The risk of continued attempts at vaginal delivery include but are note limited to worsening fetal or maternal status.  After consideration of options the patient is amenable to proceed with primary cesarean section for delivery.   2) Fetus - cat II tracing unresponsive to interventions to ameliorate including position changes, amnioinfusion, and stopping pitocin.  Patient is having recurrent late decelerations some of which have been prolonged.    Malachy Mood, MD, [facility], [facility] 04/16/2019, 7:13 PM

## 2019-04-16 NOTE — Progress Notes (Addendum)
L&D Progress Note     S: Has been nauseated-received some Zofran earlier with relief  O: BP 121/90 (BP Location: Left Arm)   Pulse 89   Temp 98.5 F (36.9 C) (Oral)   Resp 16   Ht 4\' 9"  (1.448 m)   Wt 80.7 kg   LMP 07/01/2018 (Approximate)   SpO2 100%   BMI 38.52 kg/m    General: in NAD, still comfortable with epidural  FHR: repetitive late decelerations from a baseline of 150 in semi Fowler's position, resolved with turning to the right side. Accelerations to 170s to 180 with moderate variability  IUPC: 4 contractions in 10 minutes with 150 mvus with Pitocin at 12 miu/min Baseline tonus 25 mmHG. After turning patient, the baseline tonus was not returning to 25 mmHG and there was no rest between contractions. Pitocin reduced to 11 miu/min.  Cervix: 5/80%/-1  A: Some cervical progress even though mvus not adequate FHR decelerations resolved with position change  P: Continue to monitor progress and fetal /maternal well being Titrate Pitocin up to get adequate mvus if tolerated by baby.  Dalia Heading, CNM

## 2019-04-16 NOTE — Anesthesia Procedure Notes (Signed)
Epidural Patient location during procedure: OB Start time: 04/16/2019 8:39 AM End time: 04/16/2019 8:43 AM  Staffing Anesthesiologist: , Precious Haws, MD Performed: anesthesiologist   Preanesthetic Checklist Completed: patient identified, site marked, surgical consent, pre-op evaluation, timeout performed, IV checked, risks and benefits discussed and monitors and equipment checked  Epidural Patient position: sitting Prep: ChloraPrep Patient monitoring: heart rate, continuous pulse ox and blood pressure Approach: midline Location: L3-L4 Injection technique: LOR saline  Needle:  Needle type: Tuohy  Needle gauge: 17 G Needle length: 9 cm and 9 Needle insertion depth: 6 cm Catheter type: closed end flexible Catheter size: 19 Gauge Catheter at skin depth: 11 cm Test dose: negative and 1.5% lidocaine with Epi 1:200 K  Assessment Sensory level: T10 Events: blood not aspirated, injection not painful, no injection resistance, negative IV test and no paresthesia  Additional Notes 1 attempt Pt. Evaluated and documentation done after procedure finished. Patient identified. Risks/Benefits/Options discussed with patient including but not limited to bleeding, infection, nerve damage, paralysis, failed block, incomplete pain control, headache, blood pressure changes, nausea, vomiting, reactions to medication both or allergic, itching and postpartum back pain. Confirmed with bedside nurse the patient's most recent platelet count. Confirmed with patient that they are not currently taking any anticoagulation, have any bleeding history or any family history of bleeding disorders. Patient expressed understanding and wished to proceed. All questions were answered. Sterile technique was used throughout the entire procedure. Please see nursing notes for vital signs. Test dose was given through epidural catheter and negative prior to continuing to dose epidural or start infusion. Warning signs of  high block given to the patient including shortness of breath, tingling/numbness in hands, complete motor block, or any concerning symptoms with instructions to call for help. Patient was given instructions on fall risk and not to get out of bed. All questions and concerns addressed with instructions to call with any issues or inadequate analgesia.   Patient tolerated the insertion well without immediate complications.Reason for block:procedure for pain

## 2019-04-16 NOTE — Progress Notes (Signed)
L&D progress Note   S: Having a lot of back pain this Am. Much more comfortable since epidural.   O: BP 106/70 (BP Location: Left Arm)   Pulse 80   Temp (!) 97.5 F (36.4 C) (Oral)   Resp 16   Ht 4\' 9"  (1.448 m)   Wt 80.7 kg   LMP 07/01/2018 (Approximate)   SpO2 100%   BMI 38.52 kg/m     General: appears comfortable  Toco: difficulty seeing contractions with external. AROM clear fluid. IUPC inserted FHR: repetitive decelerations since 0920 (difficulty seeing contractions with toco), followed by 3 minute deceleration with an 8 minute period of hyperstimulation (contractions seen with IUPC in place). Pitocin (was at 14 min/min) discontinued at 0946 and patient placed on left side.  Decelerations resolved. Baseline 150s with accelerations to 170s to 180, moderate variability   Cervix: 4/70%/-2/anterior  A: FHR deceleration due to hyperstimulation, now resolved  P: Keep Pitocin off until 1030, if no further problems, then restart Pitocin at 2 miu.min. Watch FHR pattern closely   Dalia Heading, CNM

## 2019-04-16 NOTE — Anesthesia Preprocedure Evaluation (Addendum)
Anesthesia Evaluation  Patient identified by MRN, date of birth, ID band Patient awake    Reviewed: Allergy & Precautions, H&P , NPO status , Patient's Chart, lab work & pertinent test results  History of Anesthesia Complications Negative for: history of anesthetic complications  Airway Mallampati: III  TM Distance: <3 FB Neck ROM: full    Dental  (+) Chipped   Pulmonary neg pulmonary ROS,           Cardiovascular Exercise Tolerance: Good hypertension, + Valvular Problems/Murmurs      Neuro/Psych    GI/Hepatic negative GI ROS,   Endo/Other  Hypothyroidism   Renal/GU Renal disease  negative genitourinary   Musculoskeletal   Abdominal   Peds  Hematology negative hematology ROS (+)   Anesthesia Other Findings Past Medical History: No date: Amenorrhea No date: Chronic pelvic pain in female No date: Dyspareunia No date: Dysphagia No date: Endometriosis No date: Enlarged thyroid No date: Follicular cyst of ovary No date: Heart murmur No date: Heavy periods No date: Hypothyroidism     Comment:  HASHIMOTO'S No date: IBS (irritable bowel syndrome) No date: Kidney stone No date: PCOS (polycystic ovarian syndrome) No date: Pelvic pain in female No date: Raynaud disease No date: Syncope No date: UTI (lower urinary tract infection)  Past Surgical History: 10/2013: APPENDECTOMY No date: CHOLECYSTECTOMY 08/2013: COLON SURGERY 2012/2013: WISDOM TOOTH EXTRACTION     Comment:  ONE  BMI    Body Mass Index: 38.52 kg/m      Reproductive/Obstetrics (+) Pregnancy                             Anesthesia Physical Anesthesia Plan  ASA: III  Anesthesia Plan: Epidural   Post-op Pain Management:    Induction:   PONV Risk Score and Plan:   Airway Management Planned:   Additional Equipment:   Intra-op Plan:   Post-operative Plan:   Informed Consent: I have reviewed the patients  History and Physical, chart, labs and discussed the procedure including the risks, benefits and alternatives for the proposed anesthesia with the patient or authorized representative who has indicated his/her understanding and acceptance.       Plan Discussed with: Anesthesiologist  Anesthesia Plan Comments:         Anesthesia Quick Evaluation

## 2019-04-17 ENCOUNTER — Encounter: Payer: Self-pay | Admitting: Certified Nurse Midwife

## 2019-04-17 LAB — CBC
HCT: 29.1 % — ABNORMAL LOW (ref 36.0–46.0)
Hemoglobin: 9.3 g/dL — ABNORMAL LOW (ref 12.0–15.0)
MCH: 29.4 pg (ref 26.0–34.0)
MCHC: 32 g/dL (ref 30.0–36.0)
MCV: 92.1 fL (ref 80.0–100.0)
Platelets: 182 10*3/uL (ref 150–400)
RBC: 3.16 MIL/uL — ABNORMAL LOW (ref 3.87–5.11)
RDW: 13.2 % (ref 11.5–15.5)
WBC: 12.4 10*3/uL — ABNORMAL HIGH (ref 4.0–10.5)
nRBC: 0 % (ref 0.0–0.2)

## 2019-04-17 MED ORDER — LEVOTHYROXINE SODIUM 125 MCG PO TABS
125.0000 ug | ORAL_TABLET | Freq: Every day | ORAL | Status: DC
  Administered 2019-04-17 – 2019-04-19 (×3): 125 ug via ORAL
  Filled 2019-04-17 (×3): qty 1

## 2019-04-17 MED ORDER — FERROUS SULFATE 325 (65 FE) MG PO TABS
325.0000 mg | ORAL_TABLET | Freq: Every day | ORAL | Status: DC
  Administered 2019-04-17 – 2019-04-19 (×3): 325 mg via ORAL
  Filled 2019-04-17 (×3): qty 1

## 2019-04-17 NOTE — Lactation Note (Signed)
This note was copied from a baby's chart. Lactation Consultation Note  Patient Name: Victoria Becker [id number] Date: 04/17/2019 Reason for consult: Follow-up assessment;Mother's request;Difficult latch;Primapara;Term;Other (Comment)(Hypoglycemia)  Mom has struggled with breast feeding all day requiring assistance with positioning, latch, nipple shield and supplementation d/t hypoglycemia.  Mom can hand express colostrum and we spoon feed whatever she hand expresses.  Victoria Becker has been going to the breast with nipple shield and once today latched without nipple shield.  The best breast feed today was when Victoria Becker sucked for 20 minutes from one breast and took 18 ml via curved tip syringe along side of nipple shield.  Shortly after that feeding while we were changing a soiled pamper, he spit 1/2 of feeding back up.  For this feeding Victoria Becker is so sleepy that we can only get him to latch to take a few sucks.  FOB supplemented 20 ml of Gerber via bottlefeed.  Reviewed supply and demand and encouraged mom to continue to stimulate breast by putting Victoria Becker to the breast, hand express or pump to bring in mature milk and ensure a plentiful milk supply.   Maternal Data Formula Feeding for Exclusion: No Reason for exclusion: (Hypoglycemia) Has patient been taught Hand Expression?: Yes Does the patient have breastfeeding experience prior to this delivery?: No  Feeding Feeding Type: Bottle Fed - Formula Nipple Type: Slow - flow  LATCH Score Latch: Repeated attempts needed to sustain latch, nipple held in mouth throughout feeding, stimulation needed to elicit sucking reflex.  Audible Swallowing: None  Type of Nipple: Flat  Comfort (Breast/Nipple): Filling, red/small blisters or bruises, mild/mod discomfort  Hold (Positioning): Assistance needed to correctly position infant at breast and maintain latch.  LATCH Score: 4  Interventions Interventions: Assisted with latch  Lactation Tools Discussed/Used WIC  Program: No([employer])   Consult Status Consult Status: Follow-up Follow-up type: Call as needed    Jarold Motto 04/17/2019, 9:57 PM

## 2019-04-17 NOTE — Progress Notes (Signed)
POD #1 LTCS Subjective:  Doing well. Tolerating regular diet. Has voided since foley removed this AM. Baby having some trouble with maintaining blood sugars. Difficulty latching on. Is supplementing with formula. Baby also has rash around eyes. Peds notified.   Objective:  Blood pressure 116/90, pulse 88, temperature 97.6 F (36.4 C), temperature source Oral, resp. rate 18, height 4\' 9"  (1.448 m), weight 80.7 kg, last menstrual period 07/01/2018, SpO2 98 %, unknown if currently breastfeeding.  General: WF in NAD, alert and oriented Pulmonary: no increased work of breathing/ CTAB Heart: RRR without murmur Abdomen: non-distended, non-tender, fundus firm at level of umbilicus-1FB/ML/NT, BS active Incision:Honeycomb dressing C+D+I, ON Q intact Extremities: SCDs on  Results for orders placed or performed during the hospital encounter of 04/15/19 (from the past 72 hour(s))  Type and screen [facility]     Status: None   Collection Time: 04/15/19 10:05 AM  Result Value Ref Range   ABO/RH(D) O POS    Antibody Screen NEG    Sample Expiration      04/18/2019,2359 Performed at [facility], [address] [place], [place] [address]   CBC     Status: Abnormal   Collection Time: 04/15/19 10:05 AM  Result Value Ref Range   WBC 11.9 (H) 4.0 - 10.5 K/uL   RBC 4.00 3.87 - 5.11 MIL/uL   Hemoglobin 11.8 (L) 12.0 - 15.0 g/dL   HCT 60.435.8 (L) 54.036.0 - 98.146.0 %   MCV 89.5 80.0 - 100.0 fL   MCH 29.5 26.0 - 34.0 pg   MCHC 33.0 30.0 - 36.0 g/dL   RDW 19.113.1 47.811.5 - 29.515.5 %   Platelets 276 150 - 400 K/uL   nRBC 0.0 0.0 - 0.2 %    Comment: Performed at [facility], [address] [place], [place] [address]  Comprehensive metabolic panel     Status: Abnormal   Collection Time: 04/15/19 10:05 AM  Result Value Ref Range   Sodium 136 135 - 145 mmol/L   Potassium 4.0 3.5 - 5.1 mmol/L   Chloride 104 98 - 111 mmol/L   CO2 20 (L) 22 - 32 mmol/L   Glucose, Bld 92 70 - 99  mg/dL   BUN 10 6 - 20 mg/dL   Creatinine, Ser 8.650.48 0.44 - 1.00 mg/dL   Calcium 9.5 8.9 - 78.410.3 mg/dL   Total Protein 6.9 6.5 - 8.1 g/dL   Albumin 3.4 (L) 3.5 - 5.0 g/dL   AST 16 15 - 41 U/L   ALT 13 0 - 44 U/L   Alkaline Phosphatase 146 (H) 38 - 126 U/L   Total Bilirubin 0.3 0.3 - 1.2 mg/dL   GFR calc non Af Amer >60 >60 mL/min   GFR calc Af Amer >60 >60 mL/min   Anion gap 12 5 - 15    Comment: Performed at [facility], [address] [place], [place] [address]  CBC     Status: Abnormal   Collection Time: 04/17/19  6:10 AM  Result Value Ref Range   WBC 12.4 (H) 4.0 - 10.5 K/uL   RBC 3.16 (L) 3.87 - 5.11 MIL/uL   Hemoglobin 9.3 (L) 12.0 - 15.0 g/dL   HCT 52.829.1 (L) 41.336.0 - 24.446.0 %   MCV 92.1 80.0 - 100.0 fL   MCH 29.4 26.0 - 34.0 pg   MCHC 32.0 30.0 - 36.0 g/dL   RDW 01.013.2 27.211.5 - 53.615.5 %   Platelets 182 150 - 400 K/uL  nRBC 0.0 0.0 - 0.2 %    Comment: Performed at [facility], [address] [place], [place] [address]     Assessment:   23 y.o. G1P1001 postoperativeday # 1-stable  Ambulate with assist.  Regular diet  Resume levothyroxine at prepregnancy dose   Plan:  1) Acute blood loss anemia - hemodynamically stable and asymptomatic - po ferrous sulfate/ vitamins  2)O POS/ RI/VI  3) TDAP UTD 02/03/2019  4) Breast/Bottle/Contraception/undecided  5) Disposition-POD #2 or [place], CNM

## 2019-04-17 NOTE — Anesthesia Post-op Follow-up Note (Signed)
  Anesthesia Pain Follow-up Note  Patient: Victoria Becker  Day #: 1  Date of Follow-up: 04/17/2019 Time: 2:48 PM  Last Vitals:  Vitals:   04/17/19 0810 04/17/19 1213  BP: 116/90 111/83  Pulse: 88 (!) 104  Resp: 18 18  Temp: 36.4 C 36.6 C  SpO2: 98% 100%    Level of Consciousness: alert  Pain: mild   Side Effects:None  Catheter Site Exam:clean, dry  Anti-Coag Meds (From admission, onward)   Start     Dose/Rate Route Frequency Ordered Stop   04/17/19 2000  enoxaparin (LOVENOX) injection 40 mg     40 mg Subcutaneous Every 24 hours 04/16/19 2343         Plan: D/C from anesthesia care at surgeon's request  Martha Clan

## 2019-04-17 NOTE — Plan of Care (Signed)
Transferred to room 338. Alert and oriented with aporp. Affect. Color sl. Pale. Skin W&D. BBS clear. Oriented to room and Fall Prevention and Pt. V/O.

## 2019-04-17 NOTE — Anesthesia Postprocedure Evaluation (Signed)
Anesthesia Post Note  Patient: Victoria Becker  Procedure(s) Performed: CESAREAN SECTION (N/A )  Patient location during evaluation: Mother Baby Anesthesia Type: Epidural Level of consciousness: awake and alert Pain management: pain level controlled Vital Signs Assessment: post-procedure vital signs reviewed and stable Respiratory status: spontaneous breathing, nonlabored ventilation and respiratory function stable Cardiovascular status: stable Postop Assessment: no headache, no backache, able to ambulate, adequate PO intake and no apparent nausea or vomiting Anesthetic complications: no     Last Vitals:  Vitals:   04/17/19 0810 04/17/19 1213  BP: 116/90 111/83  Pulse: 88 (!) 104  Resp: 18 18  Temp: 36.4 C 36.6 C  SpO2: 98% 100%    Last Pain:  Vitals:   04/17/19 1215  TempSrc:   PainSc: 0-No pain                 Martha Clan

## 2019-04-18 ENCOUNTER — Encounter: Payer: Self-pay | Admitting: Obstetrics and Gynecology

## 2019-04-18 MED ORDER — ONDANSETRON 4 MG PO TBDP
4.0000 mg | ORAL_TABLET | Freq: Three times a day (TID) | ORAL | Status: DC | PRN
  Administered 2019-04-18: 4 mg via ORAL
  Filled 2019-04-18: qty 1

## 2019-04-18 MED ORDER — OXYCODONE HCL 5 MG PO TABS
5.0000 mg | ORAL_TABLET | Freq: Four times a day (QID) | ORAL | Status: DC | PRN
  Administered 2019-04-18: 5 mg via ORAL
  Filled 2019-04-18 (×2): qty 1

## 2019-04-18 MED ORDER — ACETAMINOPHEN 500 MG PO TABS
1000.0000 mg | ORAL_TABLET | Freq: Four times a day (QID) | ORAL | Status: DC | PRN
  Administered 2019-04-18 – 2019-04-19 (×2): 1000 mg via ORAL
  Filled 2019-04-18 (×2): qty 2

## 2019-04-18 NOTE — Progress Notes (Signed)
Subjective:   Post Op Day 1: Patient reports doing well. She is a little more sore today which she attributes to being up and moving around more. She is tolerating regular diet and her pain is controlled with PO pain medication. She is ambulating and voiding without difficulty. She has had a bowel movement. Breastfeeding has been challenging and she is working closely with the lactation specialists.   Objective:  Vital signs in last 24 hours: Temp:  [97.7 F (36.5 C)-98 F (36.7 C)] 97.7 F (36.5 C) (06/15 0758) Pulse Rate:  [81-110] 81 (06/15 0758) Resp:  [18-20] 18 (06/15 0758) BP: (111-129)/(79-95) 119/80 (06/15 0758) SpO2:  [97 %-100 %] 97 % (06/15 0758)    General: NAD Pulmonary: no increased work of breathing Abdomen: non-distended, non-tender, fundus firm at level of umbilicus Extremities: no edema, no erythema, no tenderness Incision: honeycomb dressing is C/D/I, On Q pump dressing has small serosang drainage.  Results for orders placed or performed during the hospital encounter of 04/15/19 (from the past 72 hour(s))  Type and screen [place]     Status: None   Collection Time: 04/15/19 10:05 AM  Result Value Ref Range   ABO/RH(D) O POS    Antibody Screen NEG    Sample Expiration      04/18/2019,2359 Performed at [facility], [address] [place], [place] [address]   CBC     Status: Abnormal   Collection Time: 04/15/19 10:05 AM  Result Value Ref Range   WBC 11.9 (H) 4.0 - 10.5 K/uL   RBC 4.00 3.87 - 5.11 MIL/uL   Hemoglobin 11.8 (L) 12.0 - 15.0 g/dL   HCT 35.8 (L) 36.0 - 46.0 %   MCV 89.5 80.0 - 100.0 fL   MCH 29.5 26.0 - 34.0 pg   MCHC 33.0 30.0 - 36.0 g/dL   RDW 13.1 11.5 - 15.5 %   Platelets 276 150 - 400 K/uL   nRBC 0.0 0.0 - 0.2 %    Comment: Performed at [facility], [address] [place], [place] [address]  Comprehensive metabolic panel     Status: Abnormal   Collection Time: 04/15/19 10:05 AM   Result Value Ref Range   Sodium 136 135 - 145 mmol/L   Potassium 4.0 3.5 - 5.1 mmol/L   Chloride 104 98 - 111 mmol/L   CO2 20 (L) 22 - 32 mmol/L   Glucose, Bld 92 70 - 99 mg/dL   BUN 10 6 - 20 mg/dL   Creatinine, Ser 0.48 0.44 - 1.00 mg/dL   Calcium 9.5 8.9 - 10.3 mg/dL   Total Protein 6.9 6.5 - 8.1 g/dL   Albumin 3.4 (L) 3.5 - 5.0 g/dL   AST 16 15 - 41 U/L   ALT 13 0 - 44 U/L   Alkaline Phosphatase 146 (H) 38 - 126 U/L   Total Bilirubin 0.3 0.3 - 1.2 mg/dL   GFR calc non Af Amer >60 >60 mL/min   GFR calc Af Amer >60 >60 mL/min   Anion gap 12 5 - 15    Comment: Performed at [facility], [address] [place],  [address]  CBC     Status: Abnormal   Collection Time: 04/17/19  6:10 AM  Result Value Ref Range   WBC 12.4 (H) 4.0 - 10.5 K/uL   RBC 3.16 (L) 3.87 - 5.11 MIL/uL   Hemoglobin 9.3 (L) 12.0 - 15.0 g/dL   HCT 29.1 (L) 36.0 -  46.0 %   MCV 92.1 80.0 - 100.0 fL   MCH 29.4 26.0 - 34.0 pg   MCHC 32.0 30.0 - 36.0 g/dL   RDW 16.113.2 09.611.5 - 04.515.5 %   Platelets 182 150 - 400 K/uL   nRBC 0.0 0.0 - 0.2 %    Comment: Performed at [facility], [address] [place], [place] [address]    Intake/Output Summary (Last 24 hours) at 04/18/2019 1009 Last data filed at 04/17/2019 1337 Gross per 24 hour  Intake 331 ml  Output 750 ml  Net -419 ml     Assessment:   23 y.o. G1P1001 post op day # 1  Plan:    1) Acute blood loss anemia - hemodynamically stable and asymptomatic - po ferrous sulfate  2) O positive, Rubella Immune, Varicella Immune  3) TDAP status given antepartum  4) Feeding plan breast/Lactation support as needed  5)  Education given regarding options for contraception, as well as compatibility with breast feeding if applicable.  Patient plans on oral progesterone-only contraceptive for contraception.  6) Disposition: continue routine care   Tresea MallJane Marjarie Irion, CNM [facility] 04/18/2019, 10:02 AM

## 2019-04-18 NOTE — Lactation Note (Signed)
This note was copied from a baby's chart. Lactation Consultation Note  Patient Name: Victoria Becker RCVEL'F Date: 04/18/2019 Reason for consult: Follow-up assessment;Mother's request;Primapara;Term;Other (Comment)  Mom has continued to struggle with sustaining breast feeding, especially today with Victoria Becker being supplemented with bottles of formula by FOB.  Mom finally agreed to set up Symphony pump for stimulation and supplementation with her milk.  Instructed in warmth, massage, hand expression, pumping, cleaning, labeling, collection, storage and handling of expressed milk.  He is cluster feeding.  Mom's nipples are slightly more erect since Victoria Becker has been latching and mom has also been pumping.  Mom has small wide-spaced breasts.  Mom is exhausted trying to hand express, put baby to breast and pump all day.  Mom's breasts and nipples are tender.  Coconut oil and comfort gels given for discomfort and instructed in alternating use.  Mom is also using hand expressed colostrum to rub on nipples.  FOB is very supportive assisting mom with feedings, pumpings and diaper changes.  Mom is tearful with incisional pain and has not wanted to take any medication.  Encouraged mom to take Percocet as needed reassuring her the medication was compatible with breast feeding.  Mom and baby will not be discharged until tomorrow.  Victoria Becker may be circumcised tomorrow.  Consent signed.  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  Mom has [employer] and they plan to put baby on [employer].  Information given and process explained on how to get DEBP through insurance.  Discussed with lactation consultant scheduled tomorrow about possibly loaning mom Symphony pump until can get DEBP through insurance.  Discussed lactation community resources and contact numbers and encouraged to call with any questions, concerns or assistance.  Maternal Data Formula Feeding for Exclusion: No Has patient been taught Hand Expression?:  Yes Does the patient have breastfeeding experience prior to this delivery?: No(Gr1)  Feeding Feeding Type: Bottle Fed - Breast Milk Nipple Type: Slow - flow  LATCH Score Latch: Repeated attempts needed to sustain latch, nipple held in mouth throughout feeding, stimulation needed to elicit sucking reflex.  Audible Swallowing: A few with stimulation  Type of Nipple: Flat  Comfort (Breast/Nipple): Filling, red/small blisters or bruises, mild/mod discomfort  Hold (Positioning): No assistance needed to correctly position infant at breast.  LATCH Score: 6  Interventions Interventions: Breast massage;Breast compression;Adjust position;Support pillows;Position options;Coconut oil;Comfort gels  Lactation Tools Discussed/Used Tools: Pump;Coconut oil;Comfort gels;Nipple [place] Fuel;Bottle Nipple shield size: 20 Breast pump type: Double-Electric Breast Pump(Symphomy) WIC Program: No(Mom has [employer] to put baby on Dad's BCBS) Pump Review: Setup, frequency, and cleaning;Milk Storage;Other (comment) Initiated by:: Victoria Camey,RN,BSN,IBCLC Date initiated:: 04/18/19   Consult Status Consult Status: Follow-up Date: 04/19/19(Follow up by [facility] for mom to get loaner Symphony) Follow-up type: Call as needed    Jarold Motto 04/18/2019, 9:18 PM

## 2019-04-19 DIAGNOSIS — O99892 Other specified diseases and conditions complicating childbirth: Secondary | ICD-10-CM | POA: Diagnosis not present

## 2019-04-19 MED ORDER — ACETAMINOPHEN 500 MG PO TABS: 1000.0000 mg | ORAL_TABLET | Freq: Four times a day (QID) | ORAL | 0 refills | Status: DC | PRN

## 2019-04-19 MED ORDER — LEVOTHYROXINE SODIUM 125 MCG PO TABS: 125.0000 ug | ORAL_TABLET | Freq: Every day | ORAL | 2 refills | Status: DC

## 2019-04-19 MED ORDER — FERROUS SULFATE 325 (65 FE) MG PO TABS: 325.0000 mg | ORAL_TABLET | Freq: Every day | ORAL | 1 refills | Status: DC

## 2019-04-19 MED ORDER — OXYCODONE HCL 5 MG PO TABS: 5.0000 mg | ORAL_TABLET | Freq: Four times a day (QID) | ORAL | 0 refills | Status: AC | PRN

## 2019-04-19 MED ORDER — NORETHINDRONE 0.35 MG PO TABS: 1.0000 | ORAL_TABLET | Freq: Every day | ORAL | 11 refills | Status: DC

## 2019-04-19 MED ORDER — IBUPROFEN 600 MG PO TABS: 600.0000 mg | ORAL_TABLET | Freq: Four times a day (QID) | ORAL | 3 refills | Status: DC | PRN

## 2019-04-19 NOTE — Discharge Instructions (Signed)
Discharge Instructions:   Follow-up Appointment: June 23rd at 11:30am with Dr. Georgianne Fick at [facility]  If there are any new medications, they have been ordered and will be available for pickup at the listed pharmacy on your way home from the hospital.   Call office if you have any of the following: headache, visual changes, fever >100.4 F, chills, shortness of breath, breast concerns, excessive vaginal bleeding, incision drainage or problems, leg pain or redness, depression or any other concerns. If you have vaginal discharge with an odor, let your doctor know.   It is normal to bleed for up to 6 weeks. You should not soak through more than 1 pad in 1 hour. If you have a blood clot larger than your fist with continued bleeding, call your doctor.   After a c-section, you should expect a small amount of blood or clear fluid coming from the incision and abdominal cramping/soreness. Inspect your incision site daily. Stand in front of a mirror to look for any redness, incision opening, or discolored/odorness drainage. Take a shower daily and continue good hygiene. Use own towel and washcloth (do not share). Make sure your sheets on your bed are clean. No pets sleeping around your incision site. Dressing will be removed at your postpartum visit. If the dressing does become wet or soiled underneath, it is okay to remove it.   On-Q pump: You will remove on day 5 after insertion or if the ball becomes flat before day 5. You will remove on: June 18th   Activity: Do not lift > 10 lbs for 6 weeks (do not lift anything heavier than your baby). No intercourse, tampons, swimming pools, hot tubs, baths (only showers) for 6 weeks.  No driving for 1-2 weeks. Continue prenatal vitamin, especially if breastfeeding. Increase calories and fluids (water) while breastfeeding.   Your milk will come in, in the next couple of days (right now it is colostrum). You may have a slight fever when your milk comes in, but it  should go away on its own.  If it does not, and rises above 101 F please call the doctor. You will also feel achy and your breasts will be firm. They will also start to leak. If you are breastfeeding, continue as you have been and you can pump/express milk for comfort.   If you have too much milk, your breasts can become engorged, which could lead to mastitis. This is an infection of the milk ducts. It can be very painful and you will need to notify your doctor to obtain a prescription for antibiotics. You can also treat it with a shower or hot/cold compress.   For concerns about your baby, please call your pediatrician.  For breastfeeding concerns, the lactation consultant can be reached at [phone].   Postpartum blues (feelings of happy one minute and sad another minute) are normal for the first few weeks but if it gets worse let your doctor know.   Congratulations! We enjoyed caring for you and your new bundle of joy!

## 2019-04-19 NOTE — Progress Notes (Signed)
Patient discharged home with infant. FOB present at discharge. Discharge instructions and prescriptions given and reviewed with patient. Patient verbalized understanding. Escorted out by auxillary.  

## 2019-04-19 NOTE — Lactation Note (Signed)
This note was copied from a baby's chart. Lactation Consultation Note  Patient Name: Victoria Becker Today's Date: 04/19/2019     Maternal Data    Feeding    LATCH Score                   Interventions    Lactation Tools Discussed/Used     Consult Status     LC spoke with mom this morning. Mom reports using symphony pump over night every 2-3 hours, starting to get colostrum. Reviewed with mom early hunger cues, growth spurts/cluster feedings typical for infants age, and transition of stools. Encouraged mom to continue use of coconut oil for nipple tenderness, and encouraged continued efforts to put baby to breast to feed. Mom is planning for discharge later today. Mom rented DEBP from hospital, for a month while she waits to receive a pump through her insurance.  Mom is comfortable with set-up of pump, cleaning of pump parts and pieces and milk storage.  Provided lactation contact numbers for questions or if in need of breastfeeding assistance once home.  Lavonia Drafts 04/19/2019, 11:27 AM

## 2019-04-22 ENCOUNTER — Other Ambulatory Visit: Payer: Self-pay

## 2019-04-22 ENCOUNTER — Telehealth: Payer: Self-pay

## 2019-04-22 ENCOUNTER — Encounter: Payer: Self-pay | Admitting: Obstetrics and Gynecology

## 2019-04-22 ENCOUNTER — Ambulatory Visit (INDEPENDENT_AMBULATORY_CARE_PROVIDER_SITE_OTHER): Payer: Managed Care, Other (non HMO) | Admitting: Obstetrics and Gynecology

## 2019-04-22 VITALS — BP 130/80 | Wt 169.0 lb

## 2019-04-22 DIAGNOSIS — Z4889 Encounter for other specified surgical aftercare: Secondary | ICD-10-CM

## 2019-04-22 NOTE — Telephone Encounter (Signed)
Pt calling c/o c/s on 6/13; On-Q pump was supposed to come out yesterday; one of the tubes came out fine; the other was too painful to take out.  What to do?  [phone]

## 2019-04-22 NOTE — Telephone Encounter (Signed)
Patient added to schedule at 1:15 04/22/19 with AMS

## 2019-04-22 NOTE — Telephone Encounter (Signed)
advise

## 2019-04-22 NOTE — Telephone Encounter (Signed)
1:15PM visit today to take out ON-Q catheter

## 2019-04-22 NOTE — Progress Notes (Signed)
Postoperative Follow-up Patient presents post op from 1LTCS 1weeks ago for fetal intolerance to labor.  Subjective: Patient reports marked improvement in her preop symptoms. Eating a regular diet without difficulty. Pain is controlled without any medications.  Activity: normal activities of daily living.  The patient removed her ON-Q catheters today.  The right ON-Q catheter was easily removed by the patient.  The left ON-Q catheter had resistance and was causing some pain when she tried to pull more firmly.    Objective: Blood pressure 130/80, weight 169 lb (76.7 kg), unknown if currently breastfeeding.  General: NAD Pulmonary: no increased work of breathing Abdomen: soft, non-tender, non-distended, incision D/C/I.  Honeycomb dressing removed at today's visit.  The left ON-Q catheter site was without signs of infection.  The dermabond was no longer present.  Applying traction to the catheter there was a brief moment of resistance but the catheter was removed intact with black tip noted.  There was a small kink in the mid portion of the catheter which likely explained the increased resistance the patient and myself initially noted.   Extremities: no edema Neurologic: normal gait    Admission on 04/15/2019, Discharged on 04/19/2019  Component Date Value Ref Range Status  . ABO/RH(D) 04/15/2019 O POS   Final  . Antibody Screen 04/15/2019 NEG   Final  . Sample Expiration 04/15/2019    Final                   Value:04/18/2019,2359 Performed at [facility], [address] [place], [place] [address]   . WBC 04/15/2019 11.9* 4.0 - 10.5 K/uL Final  . RBC 04/15/2019 4.00  3.87 - 5.11 MIL/uL Final  . Hemoglobin 04/15/2019 11.8* 12.0 - 15.0 g/dL Final  . HCT 69/62/952806/10/2019 35.8* 36.0 - 46.0 % Final  . MCV 04/15/2019 89.5  80.0 - 100.0 fL Final  . MCH 04/15/2019 29.5  26.0 - 34.0 pg Final  . MCHC 04/15/2019 33.0  30.0 - 36.0 g/dL Final  . RDW 41/32/440106/10/2019 13.1  11.5 - 15.5 %  Final  . Platelets 04/15/2019 276  150 - 400 K/uL Final  . nRBC 04/15/2019 0.0  0.0 - 0.2 % Final   Performed at [facility], [address] [place], [place] [address]  . Sodium 04/15/2019 136  135 - 145 mmol/L Final  . Potassium 04/15/2019 4.0  3.5 - 5.1 mmol/L Final  . Chloride 04/15/2019 104  98 - 111 mmol/L Final  . CO2 04/15/2019 20* 22 - 32 mmol/L Final  . Glucose, Bld 04/15/2019 92  70 - 99 mg/dL Final  . BUN 36/64/403406/10/2019 10  6 - 20 mg/dL Final  . Creatinine, Ser 04/15/2019 0.48  0.44 - 1.00 mg/dL Final  . Calcium 74/25/956306/10/2019 9.5  8.9 - 10.3 mg/dL Final  . Total Protein 04/15/2019 6.9  6.5 - 8.1 g/dL Final  . Albumin 87/56/433206/10/2019 3.4* 3.5 - 5.0 g/dL Final  . AST 95/18/841606/10/2019 16  15 - 41 U/L Final  . ALT 04/15/2019 13  0 - 44 U/L Final  . Alkaline Phosphatase 04/15/2019 146* 38 - 126 U/L Final  . Total Bilirubin 04/15/2019 0.3  0.3 - 1.2 mg/dL Final  . GFR calc non Af Amer 04/15/2019 >60  >60 mL/min Final  . GFR calc Af Amer 04/15/2019 >60  >60 mL/min Final  . Anion gap 04/15/2019 12  5 - 15 Final   Performed at [facility], [address] [place], [place]  [address]  . WBC 04/17/2019 12.4* 4.0 - 10.5 K/uL Final  . RBC 04/17/2019 3.16* 3.87 - 5.11 MIL/uL Final  . Hemoglobin 04/17/2019 9.3* 12.0 - 15.0 g/dL Final  . HCT 04/17/2019 29.1* 36.0 - 46.0 % Final  . MCV 04/17/2019 92.1  80.0 - 100.0 fL Final  . MCH 04/17/2019 29.4  26.0 - 34.0 pg Final  . MCHC 04/17/2019 32.0  30.0 - 36.0 g/dL Final  . RDW 04/17/2019 13.2  11.5 - 15.5 % Final  . Platelets 04/17/2019 182  150 - 400 K/uL Final  . nRBC 04/17/2019 0.0  0.0 - 0.2 % Final   Performed at [facility], [address] [place], [place] [address]    Assessment: 23 y.o. s/p 1LTCS stable  Plan: Patient has done well after surgery with no apparent complications.  I have discussed the post-operative course to date, and the expected progress moving forward.  The patient understands what complications  to be concerned about.  I will see the patient in routine follow up, or sooner if needed.    Activity plan: No heavy lifting.  Is having some difficulty with latch, we discussed pros and cons of breastfeeding.  Contraceptive choice will depend on whether she is breast feeding or formula feeding at time of 6 week follow up.  Encouraged giving IUD serious consideration given convenience   Malachy Mood, MD, [place], [facility] 04/22/2019, 10:16 PM

## 2019-04-26 ENCOUNTER — Other Ambulatory Visit: Payer: Self-pay

## 2019-04-26 ENCOUNTER — Encounter: Payer: Self-pay | Admitting: Obstetrics and Gynecology

## 2019-04-26 ENCOUNTER — Ambulatory Visit (INDEPENDENT_AMBULATORY_CARE_PROVIDER_SITE_OTHER): Payer: Managed Care, Other (non HMO) | Admitting: Obstetrics and Gynecology

## 2019-04-26 VITALS — BP 140/100 | Wt 162.6 lb

## 2019-04-26 DIAGNOSIS — Z4889 Encounter for other specified surgical aftercare: Secondary | ICD-10-CM

## 2019-04-26 NOTE — Progress Notes (Signed)
Postoperative Follow-up Patient presents post op from 1LTCS 1weeks ago for fetal intolerance to labor.  Subjective: Patient reports some improvement in her preop symptoms. Eating a regular diet without difficulty. Pain is controlled without any medications.  Activity: normal activities of daily living.  Objective: Blood pressure (!) 140/100, weight 162 lb 9.6 oz (73.8 kg), currently breastfeeding.  General: NAD Pulmonary: no increased work of breathing Abdomen: soft, non-tender, non-distended, incision D/C/I Extremities: no edema Neurologic: normal gait    Admission on 04/15/2019, Discharged on 04/19/2019  Component Date Value Ref Range Status  . ABO/RH(D) 04/15/2019 O POS   Final  . Antibody Screen 04/15/2019 NEG   Final  . Sample Expiration 04/15/2019    Final                   Value:04/18/2019,2359 Performed at [facility], [address] [place], [place] [address]   . WBC 04/15/2019 11.9* 4.0 - 10.5 K/uL Final  . RBC 04/15/2019 4.00  3.87 - 5.11 MIL/uL Final  . Hemoglobin 04/15/2019 11.8* 12.0 - 15.0 g/dL Final  . HCT 40/98/119106/10/2019 35.8* 36.0 - 46.0 % Final  . MCV 04/15/2019 89.5  80.0 - 100.0 fL Final  . MCH 04/15/2019 29.5  26.0 - 34.0 pg Final  . MCHC 04/15/2019 33.0  30.0 - 36.0 g/dL Final  . RDW 47/82/956206/10/2019 13.1  11.5 - 15.5 % Final  . Platelets 04/15/2019 276  150 - 400 K/uL Final  . nRBC 04/15/2019 0.0  0.0 - 0.2 % Final   Performed at [facility], [address] [place], [place] [address]  . Sodium 04/15/2019 136  135 - 145 mmol/L Final  . Potassium 04/15/2019 4.0  3.5 - 5.1 mmol/L Final  . Chloride 04/15/2019 104  98 - 111 mmol/L Final  . CO2 04/15/2019 20* 22 - 32 mmol/L Final  . Glucose, Bld 04/15/2019 92  70 - 99 mg/dL Final  . BUN 57/84/696206/10/2019 10  6 - 20 mg/dL Final  . Creatinine, Ser 04/15/2019 0.48  0.44 - 1.00 mg/dL Final  . Calcium 95/28/413206/10/2019 9.5  8.9 - 10.3 mg/dL Final  . Total Protein 04/15/2019 6.9  6.5 - 8.1 g/dL Final   . Albumin 44/01/027206/10/2019 3.4* 3.5 - 5.0 g/dL Final  . AST 53/66/440306/10/2019 16  15 - 41 U/L Final  . ALT 04/15/2019 13  0 - 44 U/L Final  . Alkaline Phosphatase 04/15/2019 146* 38 - 126 U/L Final  . Total Bilirubin 04/15/2019 0.3  0.3 - 1.2 mg/dL Final  . GFR calc non Af Amer 04/15/2019 >60  >60 mL/min Final  . GFR calc Af Amer 04/15/2019 >60  >60 mL/min Final  . Anion gap 04/15/2019 12  5 - 15 Final   Performed at [facility], [address] [place], [place] [address]  . WBC 04/17/2019 12.4* 4.0 - 10.5 K/uL Final  . RBC 04/17/2019 3.16* 3.87 - 5.11 MIL/uL Final  . Hemoglobin 04/17/2019 9.3* 12.0 - 15.0 g/dL Final  . HCT 95/63/875606/14/2020 29.1* 36.0 - 46.0 % Final  . MCV 04/17/2019 92.1  80.0 - 100.0 fL Final  . MCH 04/17/2019 29.4  26.0 - 34.0 pg Final  . MCHC 04/17/2019 32.0  30.0 - 36.0 g/dL Final  . RDW 43/32/951806/14/2020 13.2  11.5 - 15.5 % Final  . Platelets 04/17/2019 182  150 - 400 K/uL Final  . nRBC 04/17/2019 0.0  0.0 - 0.2 % Final   Performed at [facility],  [place], [place] [address]    Assessment: 23 y.o. s/p 1LTCS stable  Plan: Patient has done well after surgery with no apparent complications.  I have discussed the post-operative course to date, and the expected progress moving forward.  The patient understands what complications to be concerned about.  I will see the patient in routine follow up, or sooner if needed.    Activity plan: No heavy lifting.  Borderline elevated BP today - no headaches, vision changes, RUQ or epigastric pain.  No prior history of elevated BP this pregnancy.  Discussed symptoms that should warrant representation.  Will arrange one week BP check  Return in about 1 week (around 05/03/2019) for Danyel Griess BP check.   Malachy Mood, MD, [facility], [facility] 04/26/2019, 11:54 AM

## 2019-05-02 ENCOUNTER — Telehealth: Payer: Self-pay

## 2019-05-02 NOTE — Telephone Encounter (Signed)
Called pt to let her know that her FMLA paperwork was amended on 04/29/19 to say that she is allowed 8 weeks of leave instead of 6 sense she ended up having a c-section. FMLA was due today. Pt has picked up the paperwork from the front as well me faxing the paperwork to her employer.

## 2019-05-03 ENCOUNTER — Other Ambulatory Visit: Payer: Self-pay

## 2019-05-03 ENCOUNTER — Ambulatory Visit (INDEPENDENT_AMBULATORY_CARE_PROVIDER_SITE_OTHER): Payer: Managed Care, Other (non HMO) | Admitting: Obstetrics and Gynecology

## 2019-05-03 ENCOUNTER — Encounter: Payer: Self-pay | Admitting: Obstetrics and Gynecology

## 2019-05-03 VITALS — BP 140/100 | HR 123 | Wt 162.0 lb

## 2019-05-03 DIAGNOSIS — O135 Gestational [pregnancy-induced] hypertension without significant proteinuria, complicating the puerperium: Secondary | ICD-10-CM

## 2019-05-03 DIAGNOSIS — Z4889 Encounter for other specified surgical aftercare: Secondary | ICD-10-CM

## 2019-05-03 NOTE — Progress Notes (Signed)
Obstetrics & Gynecology Office Visit   Chief Complaint:  Chief Complaint  Patient presents with  . Postpartum Care    C/S 6/13/B/P check    History of Present Illness: 23 y.o. G1P1001 being seen for follow up blood pressure check today.  The patient is postpartumThe established diagnosis for the patient is gestational hypertension.  She is currently on no antihypertensives.  She reports no current symptoms attributable to her blood pressure.  Medication list reviewed medications which may contribute to BP elevation were not noted and no medications contraindicated for use in patient with current hypertension were noted.  Review of Systems: Review of Systems  Constitutional: Negative.   Eyes: Negative for blurred vision and double vision.  Neurological: Negative for headaches.    Past Medical History:  Past Medical History:  Diagnosis Date  . Amenorrhea   . Chronic pelvic pain in female   . Dyspareunia   . Dysphagia   . Endometriosis   . Enlarged thyroid   . Follicular cyst of ovary   . Heart murmur   . Heavy periods   . Hypothyroidism    HASHIMOTO'S  . IBS (irritable bowel syndrome)   . Kidney stone   . PCOS (polycystic ovarian syndrome)   . Pelvic pain in female   . Raynaud disease   . Syncope   . UTI (lower urinary tract infection)     Past Surgical History:  Past Surgical History:  Procedure Laterality Date  . APPENDECTOMY  10/2013  . CESAREAN SECTION N/A 04/16/2019   Procedure: CESAREAN SECTION;  Surgeon: Vena AustriaStaebler, Romualdo Prosise, MD;  Location: [facility] ORS;  Service: Obstetrics;  Laterality: N/A;  . CHOLECYSTECTOMY    . COLON SURGERY  08/2013  . WISDOM TOOTH EXTRACTION  2012/2013   ONE    Gynecologic History: No LMP recorded.  Obstetric History: G1P1001  Family History:  Family History  Problem Relation Age of Onset  . Diabetes Mother   . Hypertension Mother   . Diabetes Sister        GESTATIONAL  . Hypertension Sister   . Diabetes Maternal  Grandmother   . Hypertension Maternal Grandmother   . Stroke Maternal Grandmother   . Heart disease Maternal Grandmother   . Diabetes Paternal Grandmother   . Stroke Paternal Grandmother   . Heart disease Paternal Grandmother   . Cancer Paternal Grandfather        BRAIN  . Cancer Paternal Aunt [id number]       BREAST    Social History:  Social History   Socioeconomic History  . Marital status: Single    Spouse name: Not on file  . Number of children: 0  . Years of education: 2117  . Highest education level: Not on file  Occupational History  . Occupation: CNA  . Occupation: STUDENT    Comment: [employer]  Social Needs  . Financial resource strain: Not hard at all  . Food insecurity    Worry: Never true    Inability: Never true  . Transportation needs    Medical: No    Non-medical: No  Tobacco Use  . Smoking status: Never Smoker  . Smokeless tobacco: Never Used  Substance and Sexual Activity  . Alcohol use: No  . Drug use: No  . Sexual activity: Not Currently    Birth control/protection: None  Lifestyle  . Physical activity    Days per week: Not on file    Minutes per session: Not on file  .  Stress: Not on file  Relationships  . Social Herbalist on phone: Not on file    Gets together: Not on file    Attends religious service: Not on file    Active member of club or organization: Not on file    Attends meetings of clubs or organizations: Not on file    Relationship status: Not on file  . Intimate partner violence    Fear of current or ex partner: No    Emotionally abused: No    Physically abused: No    Forced sexual activity: No  Other Topics Concern  . Not on file  Social History Narrative  . Not on file    Allergies:  Allergies  Allergen Reactions  . Amoxicillin-Pot Clavulanate Nausea And Vomiting and Other (See Comments)    Projectile Vomiting vomitting Projectile Vomiting   . Hydromorphone Other (See Comments)    Migraine headache  Migraine headache   . Latex Rash    Medications: Prior to Admission medications   Medication Sig Start Date End Date Taking? Authorizing Provider  acetaminophen (TYLENOL) 500 MG tablet Take 2 tablets (1,000 mg total) by mouth every 6 (six) hours as needed for fever or headache. 04/19/19   Dalia Heading, CNM  famotidine (PEPCID) 20 MG tablet Take 1 tablet (20 mg total) by mouth 2 (two) times daily. 04/11/19   Dalia Heading, CNM  ferrous sulfate 325 (65 FE) MG tablet Take 1 tablet (325 mg total) by mouth daily with lunch. 04/19/19   Dalia Heading, CNM  ibuprofen (ADVIL) 600 MG tablet Take 1 tablet (600 mg total) by mouth every 6 (six) hours as needed. 04/19/19   Dalia Heading, CNM  levothyroxine (SYNTHROID) 125 MCG tablet Take 1 tablet (125 mcg total) by mouth daily at 6 (six) AM. 04/20/19   Dalia Heading, CNM  norethindrone (MICRONOR) 0.35 MG tablet Take 1 tablet (0.35 mg total) by mouth daily. Patient not taking: Reported on 04/26/2019 05/15/19   Dalia Heading, CNM  Prenatal Vit-Fe Fumarate-FA (MULTIVITAMIN-PRENATAL) 27-0.8 MG TABS tablet Take 1 tablet by mouth daily at 12 noon.    [provider]    Physical Exam Blood pressure (!) 140/100, pulse (!) 123, weight 162 lb (73.5 kg), not currently breastfeeding.  No LMP recorded.  General: NAD, well nourished, appears stated age 27: normocephalic, anicteric Pulmonary: No increased work of breathing Neurologic: Grossly intact Psychiatric: mood appropriate, affect full  Assessment: 23 y.o. G1P1001 presenting for blood pressure evaluation today  Plan: Problem List Items Addressed This Visit    None    Visit Diagnoses    Gestational hypertension without significant proteinuria, postpartum    -  Primary   Postoperative visit          1) Blood pressure - blood pressure at today's visit is elevated.  As a result antihypertensive therapy is currently not warranted. - additional blood work was not  obtained - asymptomatic, warning signs of preeclampsia discussed.  Malachy Mood, MD, [facility], [facility] 05/03/2019, 10:58 AM

## 2019-05-11 ENCOUNTER — Ambulatory Visit (INDEPENDENT_AMBULATORY_CARE_PROVIDER_SITE_OTHER): Payer: Managed Care, Other (non HMO) | Admitting: Certified Nurse Midwife

## 2019-05-11 ENCOUNTER — Other Ambulatory Visit: Payer: Self-pay

## 2019-05-11 ENCOUNTER — Encounter: Payer: Self-pay | Admitting: Certified Nurse Midwife

## 2019-05-11 ENCOUNTER — Telehealth: Payer: Self-pay

## 2019-05-11 VITALS — BP 102/60 | HR 98 | Ht <= 58 in | Wt 162.0 lb

## 2019-05-11 DIAGNOSIS — R42 Dizziness and giddiness: Secondary | ICD-10-CM

## 2019-05-11 NOTE — Progress Notes (Signed)
Obstetrics & Gynecology Office Visit   Chief Complaint:  Chief Complaint  Patient presents with  . Postpartum Care    Felt very dizzy and fait yesterday and today     History of Present Illness: 23 year old G1 P1, now 3-4 weeks postpartum, after delivering Eustace PenBaby Zachary via Cesarean section for fetal indications, presents with feeling lightheaded x 2 days. She had gestational hypertension postpartum and has had several blood pressure checks with elevated blood pressures (140/100). She has not been taking any antihypertensives. Denies headache, fever, cold symptoms, abdominal/ incisional pain. Lochia is minimal. Postpartum H&H 9.3/29.1%. Continues to take vitamins and iron.  Is breast feeding and supplementing with formula. Baby has gained 11 oz. Baby is feeding frequently at night, every 1.5 hours. Seems to burp well, and seems more comfortable since starting on medicine to relieve gas. Husband is working from [employer] to [employer] [facility] [employer]. Does care for baby before going to sleep. Last night he took care of baby all night so she could get rest. The extra sleep did not help her feel better. Eating OK, had a protein shake this AM and some peanut butter crackers. Has not had lunch yet (It is 4PM). Trying to drink 6 bottles of water daily. Only family in area is father (undergoing treatment for prostate cancer) and her mother in law who is a Engineer, civil (consulting)nurse who is currently working with Covid 19 patients. She does not feel comfortable asking them for help.    Review of Systems:   ROS   Past Medical History:  Past Medical History:  Diagnosis Date  . Amenorrhea   . Chronic pelvic pain in female   . Dyspareunia   . Dysphagia   . Endometriosis   . Enlarged thyroid   . Follicular cyst of ovary   . Heart murmur   . Heavy periods   . Hypothyroidism    HASHIMOTO'S  . IBS (irritable bowel syndrome)   . Kidney stone   . PCOS (polycystic ovarian syndrome)   . Pelvic pain in female   . Raynaud disease   . Syncope    . UTI (lower urinary tract infection)     Past Surgical History:  Past Surgical History:  Procedure Laterality Date  . APPENDECTOMY  10/2013  . CESAREAN SECTION N/A 04/16/2019   Procedure: CESAREAN SECTION;  Surgeon: Vena AustriaStaebler, Andreas, MD;  Location: [facility] ORS;  Service: Obstetrics;  Laterality: N/A;  . CHOLECYSTECTOMY    . COLON SURGERY  08/2013  . WISDOM TOOTH EXTRACTION  2012/2013   ONE    Gynecologic History: No LMP recorded.  Obstetric History: G1P1001  Family History:  Family History  Problem Relation Age of Onset  . Diabetes Mother   . Hypertension Mother   . Diabetes Sister        GESTATIONAL  . Hypertension Sister   . Diabetes Maternal Grandmother   . Hypertension Maternal Grandmother   . Stroke Maternal Grandmother   . Heart disease Maternal Grandmother   . Diabetes Paternal Grandmother   . Stroke Paternal Grandmother   . Heart disease Paternal Grandmother   . Cancer Paternal Grandfather        BRAIN  . Cancer Paternal Aunt [id number]       BREAST    Social History:  Social History   Socioeconomic History  . Marital status: Single    Spouse name: Not on file  . Number of children: 0  . Years of education: 2617  . Highest education  level: Not on file  Occupational History  . Occupation: CNA  . Occupation: STUDENT    Comment: [employer]  Social Needs  . Financial resource strain: Not hard at all  . Food insecurity    Worry: Never true    Inability: Never true  . Transportation needs    Medical: No    Non-medical: No  Tobacco Use  . Smoking status: Never Smoker  . Smokeless tobacco: Never Used  Substance and Sexual Activity  . Alcohol use: No  . Drug use: No  . Sexual activity: Not Currently    Birth control/protection: None  Lifestyle  . Physical activity    Days per week: Not on file    Minutes per session: Not on file  . Stress: Not on file  Relationships  . Social Herbalist on phone: Not on file    Gets together: Not on file     Attends religious service: Not on file    Active member of club or organization: Not on file    Attends meetings of clubs or organizations: Not on file    Relationship status: Not on file  . Intimate partner violence    Fear of current or ex partner: No    Emotionally abused: No    Physically abused: No    Forced sexual activity: No  Other Topics Concern  . Not on file  Social History Narrative  . Not on file    Allergies:  Allergies  Allergen Reactions  . Amoxicillin-Pot Clavulanate Nausea And Vomiting and Other (See Comments)    Projectile Vomiting vomitting Projectile Vomiting   . Hydromorphone Other (See Comments)    Migraine headache Migraine headache   . Latex Rash    Medications: Prior to Admission medications   Medication Sig Start Date End Date Taking? Authorizing Provider  acetaminophen (TYLENOL) 500 MG tablet Take 2 tablets (1,000 mg total) by mouth every 6 (six) hours as needed for fever or headache. 04/19/19  Yes Dalia Heading, CNM  ferrous sulfate 325 (65 FE) MG tablet Take 1 tablet (325 mg total) by mouth daily with lunch. 04/19/19  Yes Dalia Heading, CNM  levothyroxine (SYNTHROID) 125 MCG tablet Take 1 tablet (125 mcg total) by mouth daily at 6 (six) AM. 04/20/19  Yes Dalia Heading, CNM  Prenatal Vit-Fe Fumarate-FA (MULTIVITAMIN-PRENATAL) 27-0.8 MG TABS tablet Take 1 tablet by mouth daily at 12 noon.   Yes [provider]  norethindrone (MICRONOR) 0.35 MG tablet Take 1 tablet (0.35 mg total) by mouth daily. Patient not taking: Reported on 05/11/2019 05/15/19   Dalia Heading, CNM    Physical Exam Vitals: BP 102/60   Pulse 98   Ht 4\' 9"  (1.448 m)   Wt 162 lb (73.5 kg)   Breastfeeding Yes   BMI 35.06 kg/m   General: WF in NAD Heart: RRR without  Murmur Chest: normal respiratory effort, CTAB Extremities: no edema Neuro: alert, awake, answering questions appropriately Psyche: normal mod and full affect.  CBG: 93    A:  Lightheadedness postpartum, unknown etiology. May be feeling a little overwhelmed/ lack of sleep Blood pressure now normal, blood sugar normal  P: Discussed trying to sleep when baby sleeps as well as  maintaining hydration, eating protein rich meals RTO as scheduled or sooner for persistent or worsening symptoms.  Dalia Heading, CNM          Physical Exam   Assessment: 23 y.o. G1P1001 No problem-specific Assessment & Plan notes found  for this encounter.   Plan: Problem List Items Addressed This Visit    None

## 2019-05-11 NOTE — Telephone Encounter (Signed)
Pt calling; having problems c being lightheaded/dizzy; running hypertensive since delivery on 04/08/19.  Appt or Urgent Care?  [phone]  Adv to be seen.  Parked on [facility] ext to sched.

## 2019-05-31 ENCOUNTER — Other Ambulatory Visit: Payer: Self-pay

## 2019-05-31 ENCOUNTER — Ambulatory Visit (INDEPENDENT_AMBULATORY_CARE_PROVIDER_SITE_OTHER): Payer: Managed Care, Other (non HMO) | Admitting: Obstetrics and Gynecology

## 2019-05-31 ENCOUNTER — Encounter: Payer: Self-pay | Admitting: Obstetrics and Gynecology

## 2019-05-31 MED ORDER — MEDROXYPROGESTERONE ACETATE 150 MG/ML IM SUSP: 150.0000 mg | INTRAMUSCULAR | 3 refills | Status: AC

## 2019-05-31 NOTE — Progress Notes (Signed)
Postpartum Visit  Chief Complaint:  Chief Complaint  Patient presents with  . Postpartum Care    History of Present Illness: Patient is a 23 y.o. G1P1001 presents for postpartum visit.  Date of delivery:  Cesarean Section: Fetal intolerance to labor Pregnancy or labor problems:  no Any problems since the delivery:  no  Newborn Details:  SINGLETON :  1. BabyGender female. Birth weight: 8lbs 1oz Maternal Details:  Breast or formula feeding: plans to bottle feed, is intermittently breast feeding as well Intercourse: No  Contraception after delivery: No  Any bowel or bladder issues: No  Post partum depression/anxiety noted:  no Edinburgh Post-Partum Depression Score:6 Date of last PAP: 10/29/2019  no abnormalities   Review of Systems: Review of Systems  Constitutional: Negative.   Gastrointestinal: Negative for abdominal pain.  Genitourinary: Negative.   Skin: Negative.     The following portions of the patient's history were reviewed and updated as appropriate: allergies, current medications, past family history, past medical history, past social history, past surgical history and problem list.  Past Medical History:  Past Medical History:  Diagnosis Date  . Amenorrhea   . Chronic pelvic pain in female   . Dyspareunia   . Dysphagia   . Endometriosis   . Enlarged thyroid   . Follicular cyst of ovary   . Heart murmur   . Heavy periods   . Hypothyroidism    HASHIMOTO'S  . IBS (irritable bowel syndrome)   . Kidney stone   . PCOS (polycystic ovarian syndrome)   . Pelvic pain in female   . Raynaud disease   . Syncope   . UTI (lower urinary tract infection)     Past Surgical History:  Past Surgical History:  Procedure Laterality Date  . APPENDECTOMY  10/2013  . CESAREAN SECTION N/A 04/16/2019   Procedure: CESAREAN SECTION;  Surgeon: Vena AustriaStaebler, Jaden Batchelder, MD;  Location: [facility] ORS;  Service: Obstetrics;  Laterality: N/A;  . CHOLECYSTECTOMY    . COLON SURGERY   08/2013  . WISDOM TOOTH EXTRACTION  2012/2013   ONE    Family History:  Family History  Problem Relation Age of Onset  . Diabetes Mother   . Hypertension Mother   . Diabetes Sister        GESTATIONAL  . Hypertension Sister   . Diabetes Maternal Grandmother   . Hypertension Maternal Grandmother   . Stroke Maternal Grandmother   . Heart disease Maternal Grandmother   . Diabetes Paternal Grandmother   . Stroke Paternal Grandmother   . Heart disease Paternal Grandmother   . Cancer Paternal Grandfather        BRAIN  . Cancer Paternal Aunt [id number]       BREAST    Social History:  Social History   Socioeconomic History  . Marital status: Single    Spouse name: Not on file  . Number of children: 0  . Years of education: 2217  . Highest education level: Not on file  Occupational History  . Occupation: CNA  . Occupation: STUDENT    Comment: [employer]  Social Needs  . Financial resource strain: Not hard at all  . Food insecurity    Worry: Never true    Inability: Never true  . Transportation needs    Medical: No    Non-medical: No  Tobacco Use  . Smoking status: Never Smoker  . Smokeless tobacco: Never Used  Substance and Sexual Activity  . Alcohol use: No  .  Drug use: No  . Sexual activity: Not Currently    Birth control/protection: None  Lifestyle  . Physical activity    Days per week: Not on file    Minutes per session: Not on file  . Stress: Not on file  Relationships  . Social Herbalist on phone: Not on file    Gets together: Not on file    Attends religious service: Not on file    Active member of club or organization: Not on file    Attends meetings of clubs or organizations: Not on file    Relationship status: Not on file  . Intimate partner violence    Fear of current or ex partner: No    Emotionally abused: No    Physically abused: No    Forced sexual activity: No  Other Topics Concern  . Not on file  Social History Narrative  . Not  on file    Allergies:  Allergies  Allergen Reactions  . Amoxicillin-Pot Clavulanate Nausea And Vomiting and Other (See Comments)    Projectile Vomiting vomitting Projectile Vomiting   . Hydromorphone Other (See Comments)    Migraine headache Migraine headache   . Latex Rash    Medications: Prior to Admission medications   Medication Sig Start Date End Date Taking? Authorizing Provider  levothyroxine (SYNTHROID) 125 MCG tablet Take 1 tablet (125 mcg total) by mouth daily at 6 (six) AM. 04/20/19  Yes Dalia Heading, CNM  Prenatal Vit-Fe Fumarate-FA (MULTIVITAMIN-PRENATAL) 27-0.8 MG TABS tablet Take 1 tablet by mouth daily at 12 noon.   Yes [provider]  acetaminophen (TYLENOL) 500 MG tablet Take 2 tablets (1,000 mg total) by mouth every 6 (six) hours as needed for fever or headache. Patient not taking: Reported on 05/31/2019 04/19/19   Dalia Heading, CNM  ferrous sulfate 325 (65 FE) MG tablet Take 1 tablet (325 mg total) by mouth daily with lunch. Patient not taking: Reported on 05/31/2019 04/19/19   Dalia Heading, CNM  norethindrone (MICRONOR) 0.35 MG tablet Take 1 tablet (0.35 mg total) by mouth daily. Patient not taking: Reported on 05/11/2019 05/15/19   Dalia Heading, CNM    Physical Exam Blood pressure 124/78, height 4\' 9"  (1.448 m), weight 167 lb (75.8 kg), currently breastfeeding.  General: NAD HEENT: normocephalic, anicteric Pulmonary: No increased work of breathing Abdomen: NABS, soft, non-tender, non-distended.  Umbilicus without lesions.  No hepatomegaly, splenomegaly or masses palpable. No evidence of hernia. Incision well healed Genitourinary:  External: Normal external female genitalia.  Normal urethral meatus, normal  Bartholin's and Skene's glands.    Vagina: Normal vaginal mucosa, no evidence of prolapse.    Cervix: Grossly normal in appearance, no bleeding  Uterus: Non-enlarged, mobile, normal contour.  No CMT  Adnexa: ovaries  non-enlarged, no adnexal masses  Rectal: deferred Extremities: no edema, erythema, or tenderness Neurologic: Grossly intact Psychiatric: mood appropriate, affect full      Assessment: 23 y.o. G1P1001 presenting for 6 week postpartum visit  Plan: Problem List Items Addressed This Visit    None    Visit Diagnoses    6 weeks postpartum follow-up    -  Primary      1) Contraception - Education given regarding options for contraception, as well as compatibility with breast feeding if applicable.  Patient plans on Depo-Provera injections for contraception. - mostly bottle feeding  2)  Pap - ASCCP guidelines and rational discussed.  ASCCP guidelines and rational discussed.  Patient opts for every 3  years screening interval  3) Patient underwent screening for postpartum depression with no signs of depression  4) Return in about 1 day (around 06/01/2019) for 1 day nurse visit depo provera injection, 1 year annual.   Vena AustriaAndreas Ambera Fedele, MD, Merlinda FrederickFACOG [facility], [facility] 05/31/2019, 4:07 PM

## 2019-06-01 ENCOUNTER — Ambulatory Visit (INDEPENDENT_AMBULATORY_CARE_PROVIDER_SITE_OTHER): Payer: BC Managed Care – PPO

## 2019-06-01 DIAGNOSIS — Z3202 Encounter for pregnancy test, result negative: Secondary | ICD-10-CM

## 2019-06-01 DIAGNOSIS — Z3042 Encounter for surveillance of injectable contraceptive: Secondary | ICD-10-CM

## 2019-06-01 DIAGNOSIS — N912 Amenorrhea, unspecified: Secondary | ICD-10-CM

## 2019-06-01 LAB — POCT URINE PREGNANCY: Preg Test, Ur: NEGATIVE

## 2019-06-01 MED ORDER — MEDROXYPROGESTERONE ACETATE 150 MG/ML IM SUSP
150.0000 mg | Freq: Once | INTRAMUSCULAR | Status: AC
  Administered 2019-06-01: 150 mg via INTRAMUSCULAR

## 2019-06-01 NOTE — Progress Notes (Signed)
Patient presents today for first Depo Provera injection s/p delivery 04/16/2019. Patient not currently on menses. Pregnancy test performed per protocol (negative).Given IM LUOQ. Patient tolerated well.

## 2019-08-22 ENCOUNTER — Ambulatory Visit: Payer: Managed Care, Other (non HMO)

## 2019-08-28 NOTE — Progress Notes (Signed)
Lorelee Market, MD   Chief Complaint  Patient presents with  . Contraception    would like to switch back to OCP's    HPI:      Ms. Victoria Becker is a 23 y.o. G1P1001 who LMP was No LMP recorded. Patient has had an injection., presents today for [facility] change back to OCPs. On depo since 06/01/19 (at [facility] visit) with daily bleeding/spotting, some cramping and wt gain. Did well with tri-sprintec in past. Had nausea with other pills. No hx of HTN, DVTs, migraines with aura. Occas sex active. 6/20 PP, not breastfeeding. Had stopped bleeding prior to starting depo.  Hx of PCOS.   Patient Active Problem List   Diagnosis Date Noted  . Fetal intolerance to labor, delivered, current hospitalization 04/19/2019  . Delivery by emergency cesarean section 04/19/2019  . Postpartum care following cesarean delivery 04/19/2019  . Encounter for elective induction of labor 04/15/2019  . Hypertension affecting pregnancy 03/17/2019  . Preterm contractions 02/11/2019  . PCOS (polycystic ovarian syndrome) 12/28/2018  . Hyperemesis gravidarum 09/10/2018  . Nausea and vomiting during pregnancy 09/01/2018  . Supervision of high risk pregnancy, antepartum 08/17/2018  . Hypothyroidism due to Hashimoto's thyroiditis 10/23/2017  . Nontoxic multinodular goiter 10/23/2017    Past Surgical History:  Procedure Laterality Date  . APPENDECTOMY  10/2013  . CESAREAN SECTION N/A 04/16/2019   Procedure: CESAREAN SECTION;  Surgeon: Malachy Mood, MD;  Location: [facility] ORS;  Service: Obstetrics;  Laterality: N/A;  . CHOLECYSTECTOMY    . COLON SURGERY  08/2013  . WISDOM TOOTH EXTRACTION  2012/2013   ONE    Family History  Problem Relation Age of Onset  . Diabetes Mother   . Hypertension Mother   . Diabetes Sister        GESTATIONAL  . Hypertension Sister   . Diabetes Maternal Grandmother   . Hypertension Maternal Grandmother   . Stroke Maternal Grandmother   . Heart disease Maternal Grandmother   .  Diabetes Paternal Grandmother   . Stroke Paternal Grandmother   . Heart disease Paternal Grandmother   . Cancer Paternal Grandfather        BRAIN  . Cancer Paternal Aunt 41       BREAST    Social History   Socioeconomic History  . Marital status: Single    Spouse name: Not on file  . Number of children: 0  . Years of education: 73  . Highest education level: Not on file  Occupational History  . Occupation: CNA  . Occupation: STUDENT    Comment: [employer]  Social Needs  . Financial resource strain: Not hard at all  . Food insecurity    Worry: Never true    Inability: Never true  . Transportation needs    Medical: No    Non-medical: No  Tobacco Use  . Smoking status: Never Smoker  . Smokeless tobacco: Never Used  Substance and Sexual Activity  . Alcohol use: No  . Drug use: No  . Sexual activity: Yes    Birth control/protection: Injection  Lifestyle  . Physical activity    Days per week: Not on file    Minutes per session: Not on file  . Stress: Not on file  Relationships  . Social Herbalist on phone: Not on file    Gets together: Not on file    Attends religious service: Not on file    Active member of club or organization: Not on  file    Attends meetings of clubs or organizations: Not on file    Relationship status: Not on file  . Intimate partner violence    Fear of current or ex partner: No    Emotionally abused: No    Physically abused: No    Forced sexual activity: No  Other Topics Concern  . Not on file  Social History Narrative  . Not on file    Outpatient Medications Prior to Visit  Medication Sig Dispense Refill  . levothyroxine (SYNTHROID) 100 MCG tablet Take by mouth.    . Prenatal Vit-Fe Fumarate-FA (MULTIVITAMIN-PRENATAL) 27-0.8 MG TABS tablet Take 1 tablet by mouth daily at 12 noon.    . medroxyPROGESTERone (DEPO-PROVERA) 150 MG/ML injection Inject 1 mL (150 mg total) into the muscle every 3 (three) months. (Patient not  taking: Reported on 08/29/2019) 1 mL 3  . acetaminophen (TYLENOL) 500 MG tablet Take 2 tablets (1,000 mg total) by mouth every 6 (six) hours as needed for fever or headache. (Patient not taking: Reported on 05/31/2019) 30 tablet 0  . ferrous sulfate 325 (65 FE) MG tablet Take 1 tablet (325 mg total) by mouth daily with lunch. (Patient not taking: Reported on 05/31/2019) 30 tablet 1  . levothyroxine (SYNTHROID) 125 MCG tablet Take 1 tablet (125 mcg total) by mouth daily at 6 (six) AM. 30 tablet 2  . norethindrone (MICRONOR) 0.35 MG tablet Take 1 tablet (0.35 mg total) by mouth daily. (Patient not taking: Reported on 05/11/2019) 1 Package 11   No facility-administered medications prior to visit.       ROS:  Review of Systems  Constitutional: Negative for fever.  Gastrointestinal: Positive for diarrhea and nausea. Negative for blood in stool, constipation and vomiting.  Genitourinary: Positive for vaginal bleeding. Negative for dyspareunia, dysuria, flank pain, frequency, hematuria, urgency, vaginal discharge and vaginal pain.  Musculoskeletal: Negative for back pain.  Skin: Negative for rash.  BREAST: No symptoms   OBJECTIVE:   Vitals:  BP 126/90   Ht 4\' 9"  (1.448 m)   Wt 177 lb (80.3 kg)   Breastfeeding No   BMI 38.30 kg/m   Physical Exam Vitals signs reviewed.  Constitutional:      Appearance: She is well-developed.  Neck:     Musculoskeletal: Normal range of motion.  Pulmonary:     Effort: Pulmonary effort is normal.  Musculoskeletal: Normal range of motion.  Skin:    General: Skin is warm and dry.  Neurological:     General: No focal deficit present.     Mental Status: She is alert and oriented to person, place, and time.     Cranial Nerves: No cranial nerve deficit.  Psychiatric:        Mood and Affect: Mood normal.        Behavior: Behavior normal.        Thought Content: Thought content normal.        Judgment: Judgment normal.     Assessment/Plan: Encounter  for initial prescription of contraceptive pills - Plan: Norgestimate-Ethinyl Estradiol Triphasic (TRI-SPRINTEC) 0.18/0.215/0.25 MG-35 MCG tablet; OCP start today (depo due by 08/31/19). Condoms for 1 wk. F/u if bleeding persists after 3rd pack to eval further/sooner prn.   Breakthrough bleeding on depo  Meds ordered this encounter  Medications  . Norgestimate-Ethinyl Estradiol Triphasic (TRI-SPRINTEC) 0.18/0.215/0.25 MG-35 MCG tablet    Sig: Take 1 tablet by mouth daily.    Dispense:  84 tablet    Refill:  2  Order Specific Question:   Supervising Provider    Answer:   Nadara Mustard [[id number]]      Return if symptoms worsen or fail to improve.   B. , PA-C 08/29/2019 9:44 AM

## 2019-08-29 ENCOUNTER — Encounter: Payer: Self-pay | Admitting: Obstetrics and Gynecology

## 2019-08-29 ENCOUNTER — Other Ambulatory Visit: Payer: Self-pay

## 2019-08-29 ENCOUNTER — Ambulatory Visit (INDEPENDENT_AMBULATORY_CARE_PROVIDER_SITE_OTHER): Payer: Managed Care, Other (non HMO) | Admitting: Obstetrics and Gynecology

## 2019-08-29 VITALS — BP 126/90 | Ht <= 58 in | Wt 177.0 lb

## 2019-08-29 DIAGNOSIS — Z30011 Encounter for initial prescription of contraceptive pills: Secondary | ICD-10-CM

## 2019-08-29 DIAGNOSIS — N921 Excessive and frequent menstruation with irregular cycle: Secondary | ICD-10-CM

## 2019-08-29 MED ORDER — NORGESTIM-ETH ESTRAD TRIPHASIC 0.18/0.215/0.25 MG-35 MCG PO TABS: 1.0000 | ORAL_TABLET | Freq: Every day | ORAL | 2 refills | Status: AC

## 2019-08-29 NOTE — Patient Instructions (Signed)
I value your feedback and entrusting us with your care. If you get a [facility] patient survey, I would appreciate you taking the time to let us know about your experience today. Thank you! 

## 2020-03-06 IMAGING — DX PORTABLE ABDOMEN - 1 VIEW
1 series · 1 of 1 positions shown · non-contrast
Comparison: None.

CLINICAL DATA: Emergency C-section, sponge count

EXAM:
PORTABLE ABDOMEN - 1 VIEW

[abdomen supine]
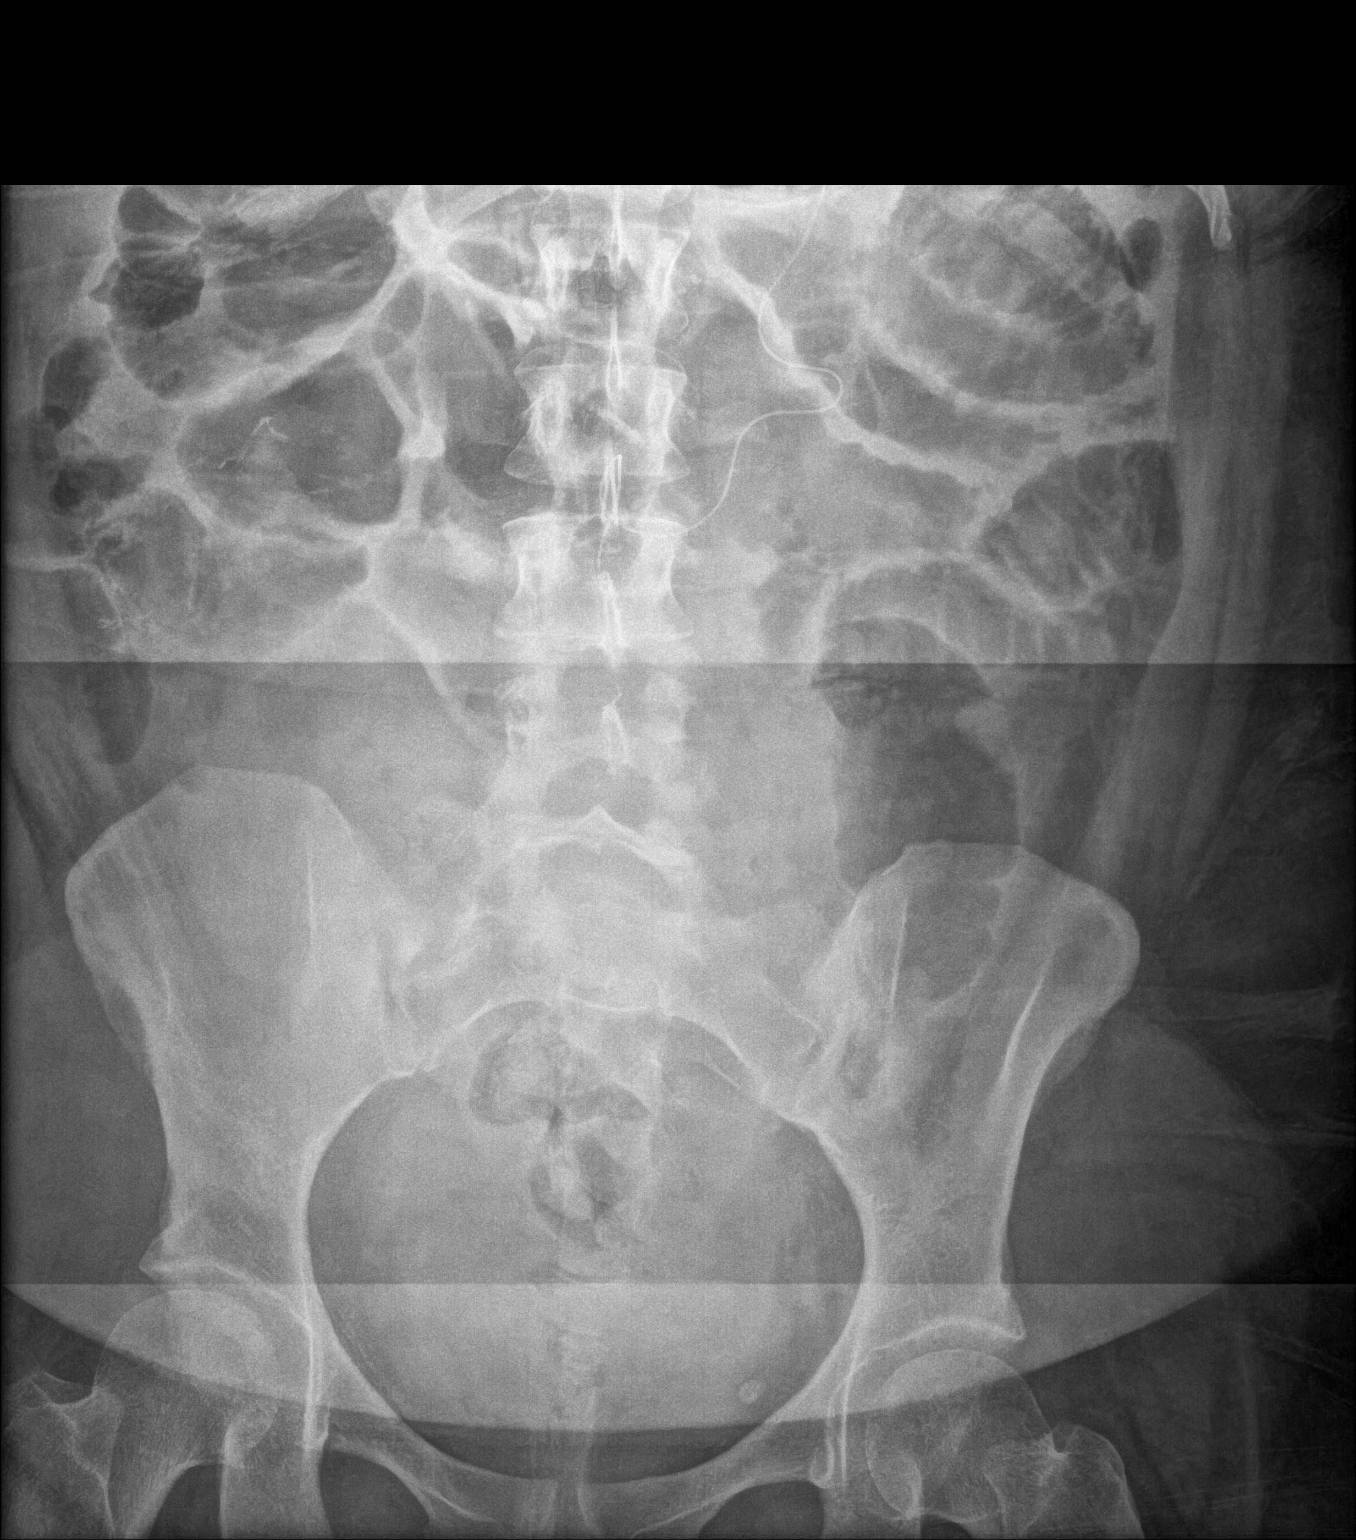

[1 of 1 positions shown; findings below may reference images not displayed]

FINDINGS: Catheter projects over left abdomen and mid lumbar spine, likely
epidural catheter. No visible unexpected radiopaque foreign body.
Mild diffuse gaseous distention of bowel.
IMPRESSION: No unexpected radiopaque foreign body.

## 2020-03-22 IMAGING — US US ART/VEN ABD/PELV/SCROTUM DOPPLER COMPLETE
1 series · 13 of 25 positions shown · non-contrast
Comparison: None.

CLINICAL DATA: Vaginal bleeding 30 minutes ago. Not sure of LMP.
Quantitative beta HCG is in progress.



[Series 1: us art/ven abd/pelv/scrotum doppler complete · 0.23mm/px · 13 of 88 slices shown]
[im 1/88]
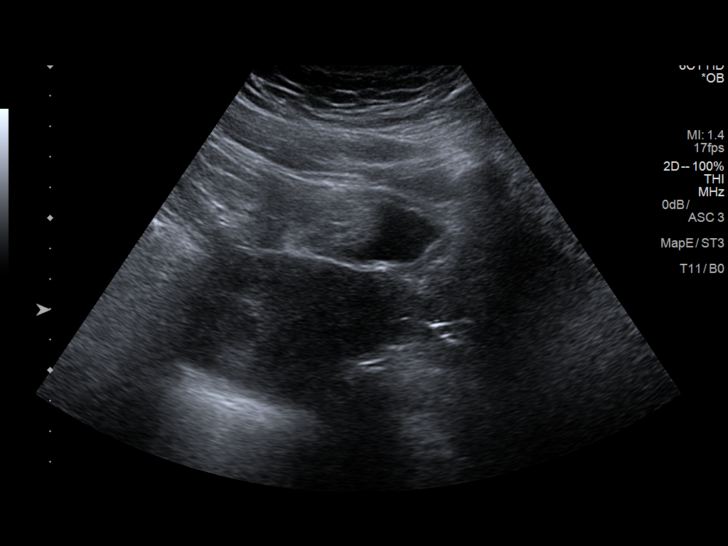
[im 8/88]
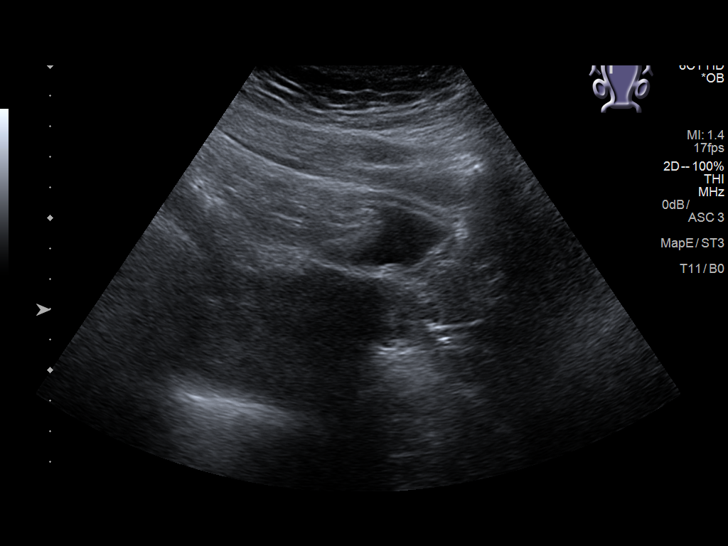
[im 15/88]
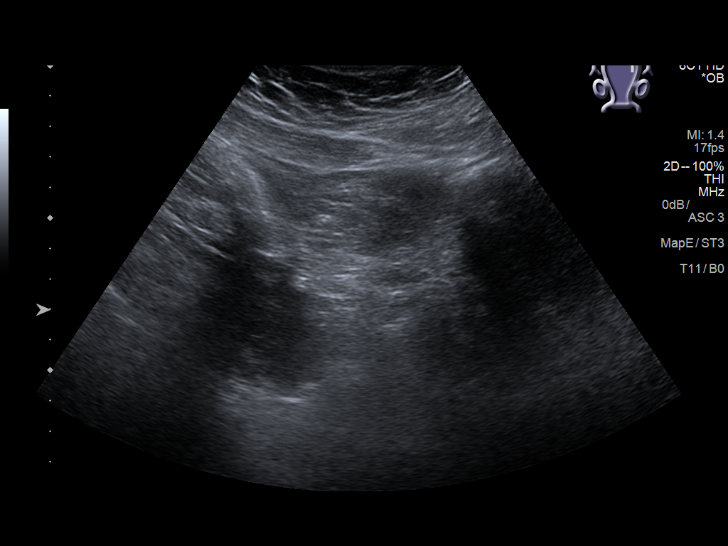
[im 22/88]
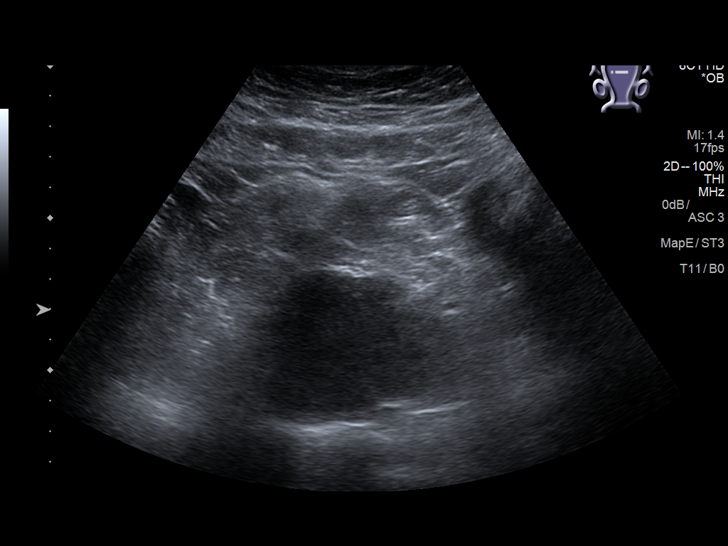
[im 30/88]
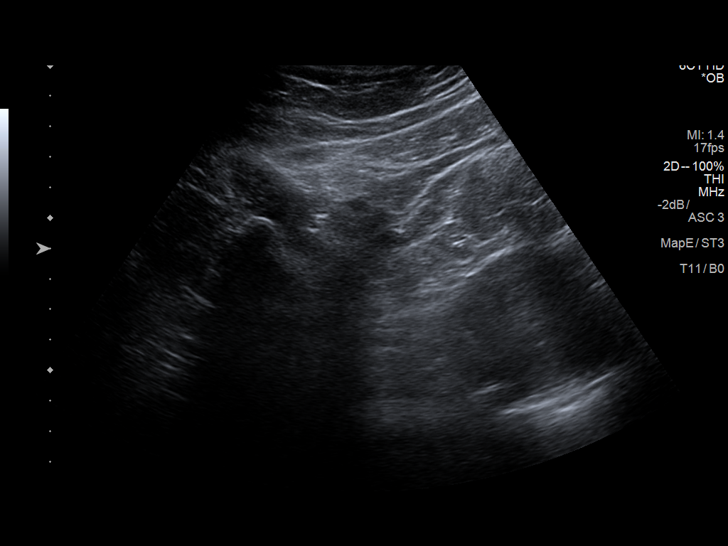
[im 37/88]
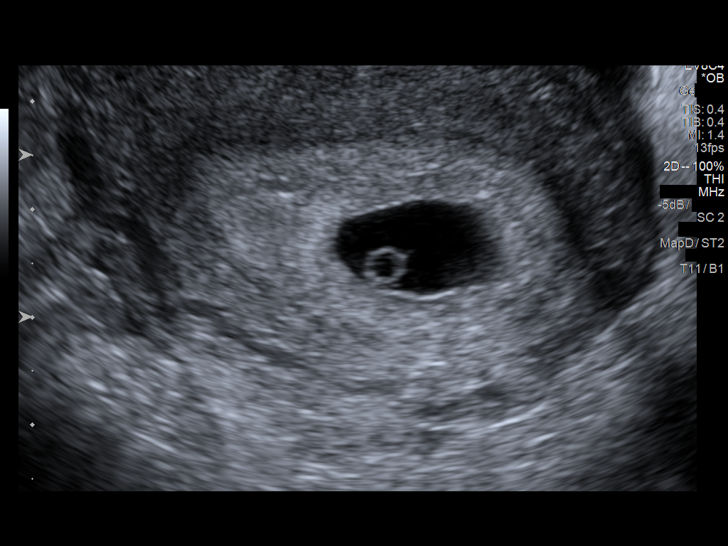
[im 44/88]
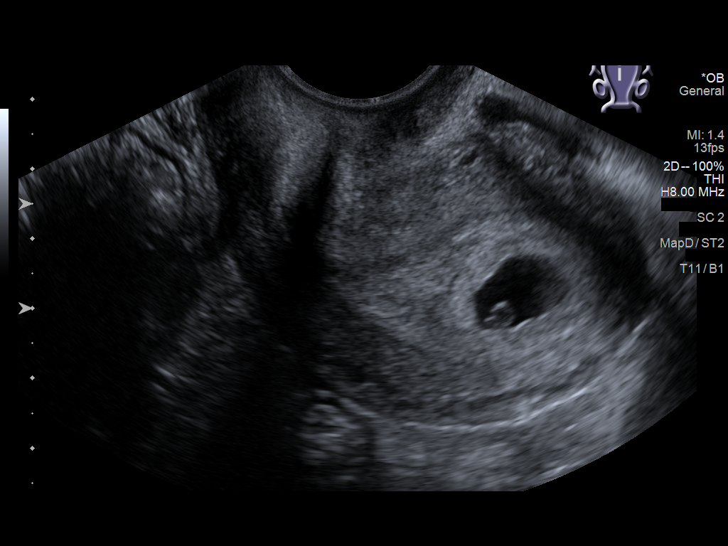
[im 51/88]
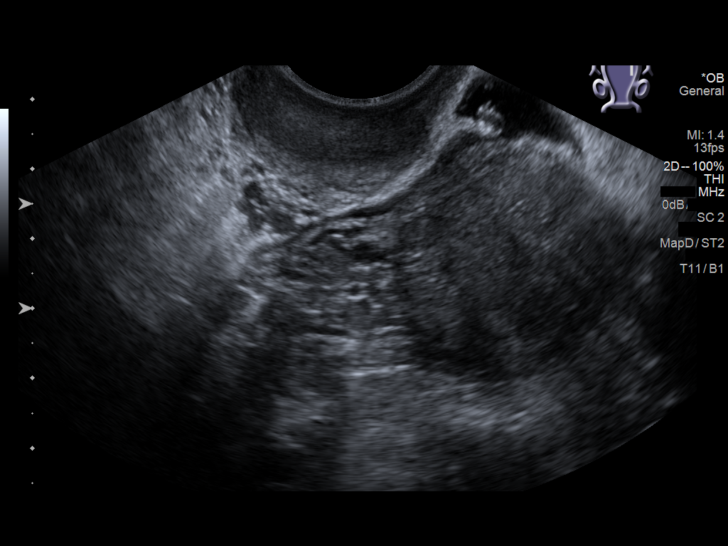
[im 59/88]
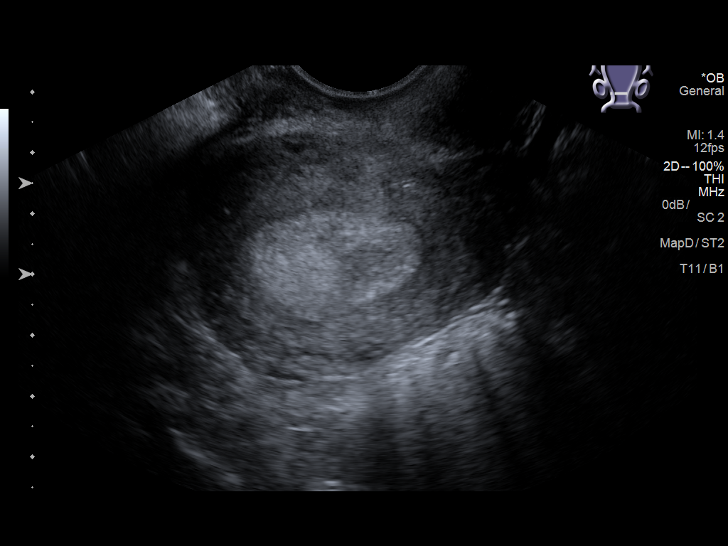
[im 66/88]
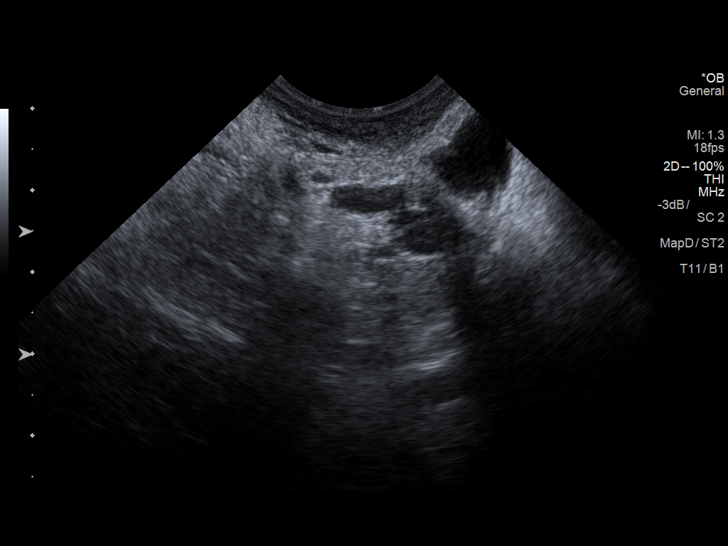
[im 73/88]
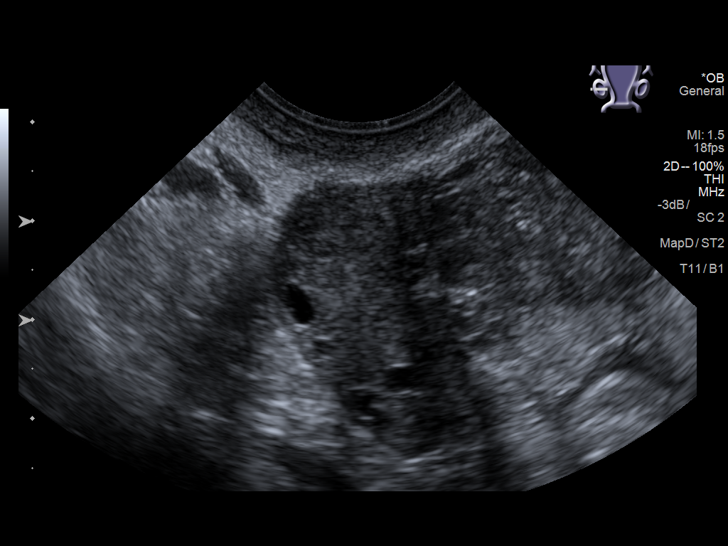
[im 80/88]
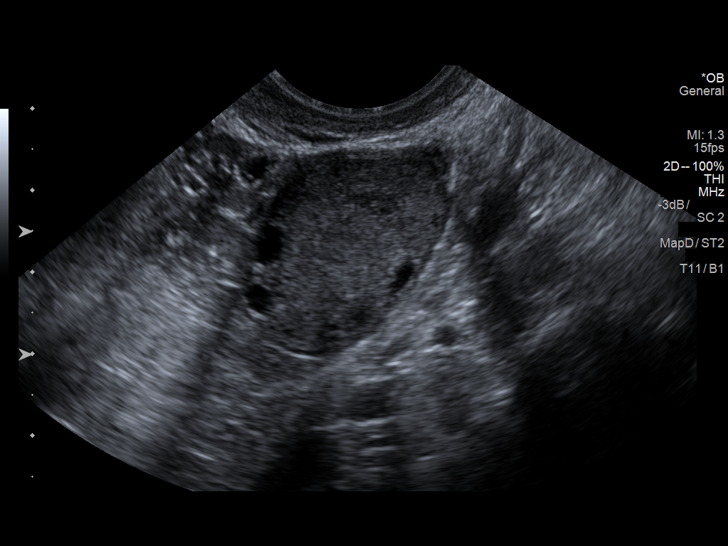
[im 88/88]
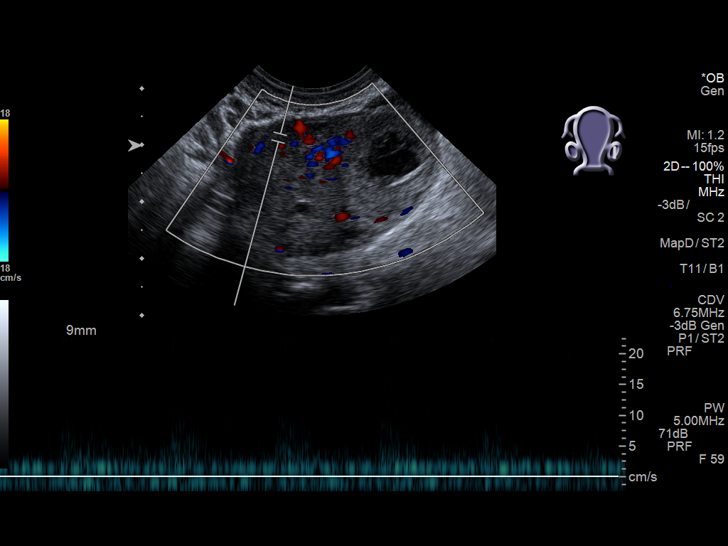

[13 of 25 positions shown; findings below may reference images not displayed]

FINDINGS: Intrauterine gestational sac: A single intrauterine gestational sac
is identified.

Yolk sac:  The yolk sac is present.

Embryo:  Not seen.

Cardiac Activity: Not seen.

MSD: 10.5 mm   5 w   6 d

Subchorionic hemorrhage:  None visualized.

Maternal uterus/adnexae: Uterus is retroflexed. No myometrial mass
lesions identified. Both ovaries are visualized and appear normal
with normal follicular changes present. Flow is demonstrated in both
ovaries on color flow Doppler imaging. Minimal free fluid in the
pelvis.

Pulsed Doppler evaluation of both ovaries demonstrates normal
appearing low-resistance arterial and venous waveforms.
IMPRESSION: 1. Probable early intrauterine gestational sac with a yolk sac, but
no fetal pole or cardiac activity yet visualized. Recommend
follow-up quantitative B-HCG levels and follow-up US in 14 days to
assess viability. This recommendation follows SRU consensus
guidelines: Diagnostic Criteria for Nonviable Pregnancy Early in the
First Trimester. N Engl J Med 2230; [DATE].
2. Normal ultrasound appearance of the ovaries. No evidence of
ovarian mass or torsion.

## 2020-04-08 IMAGING — US US OB TRANSVAGINAL
1 series · 14 of 28 positions shown · non-contrast
Comparison: None.

CLINICAL DATA: Initial evaluation for acute vaginal bleeding, early
pregnancy.

EXAM:
OBSTETRIC <14 WK US AND TRANSVAGINAL OB US
TECHNIQUE: Both transabdominal and transvaginal ultrasound examinations were
performed for complete evaluation of the gestation as well as the
maternal uterus, adnexal regions, and pelvic cul-de-sac.
Transvaginal technique was performed to assess early pregnancy.

[Series 1: us ob transvaginal · 0.25mm/px · 14 of 89 slices shown]
[im 4/89]
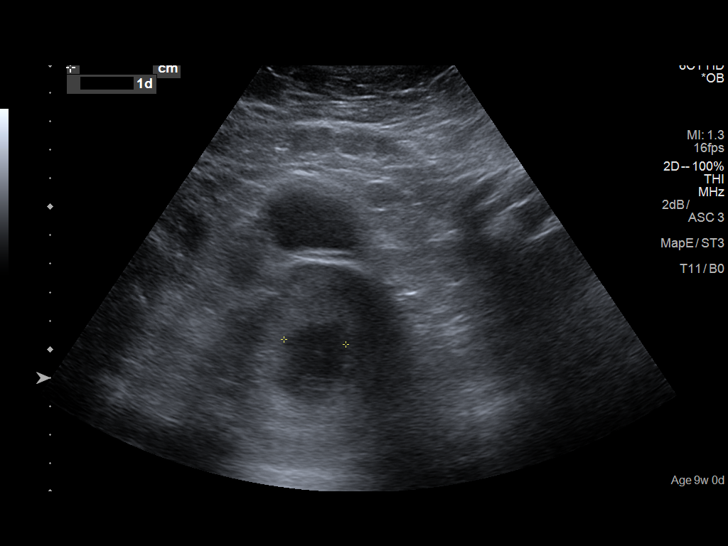
[im 10/89]
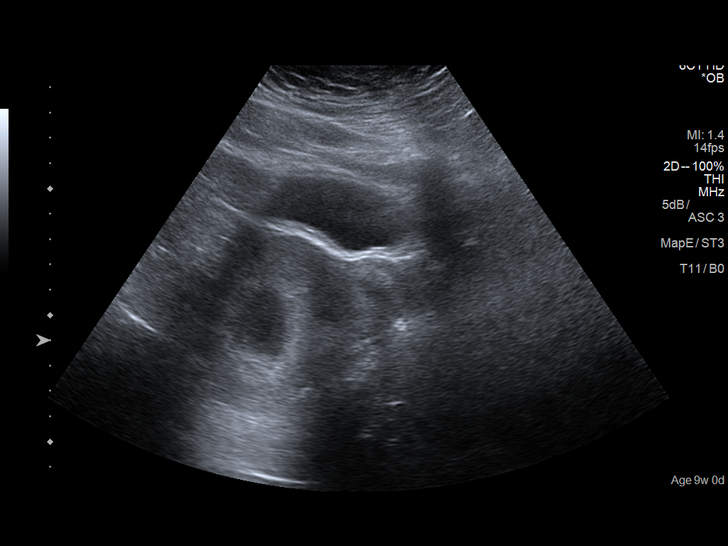
[im 17/89]
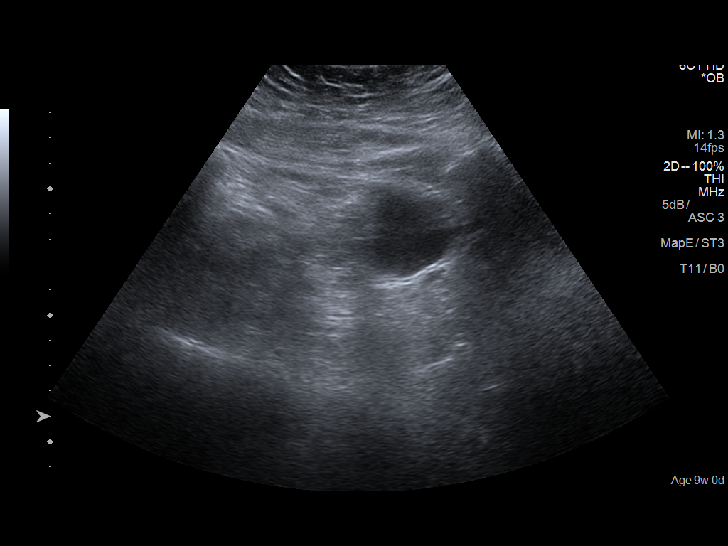
[im 23/89]
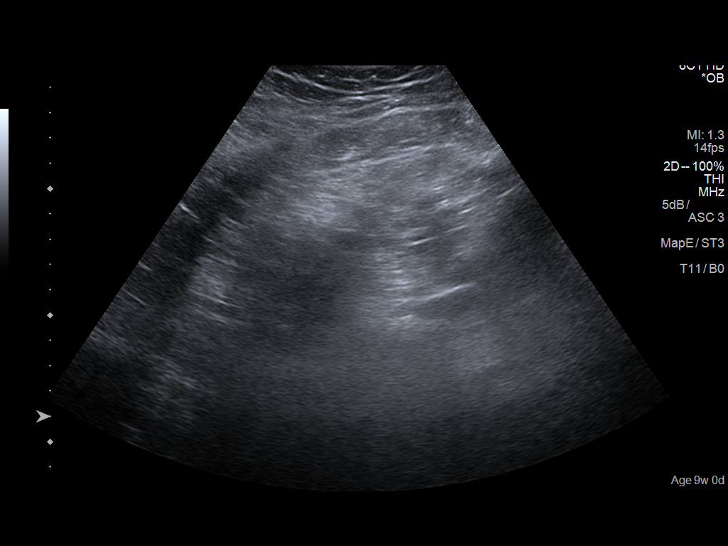
[im 30/89]
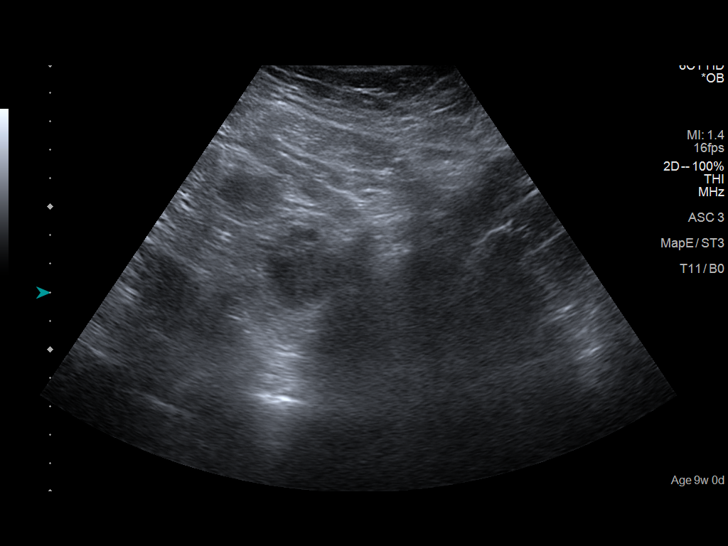
[im 36/89]
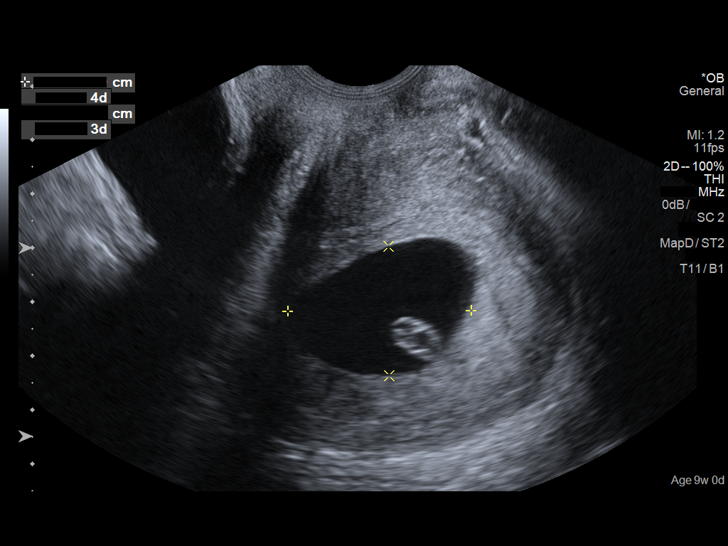
[im 43/89]
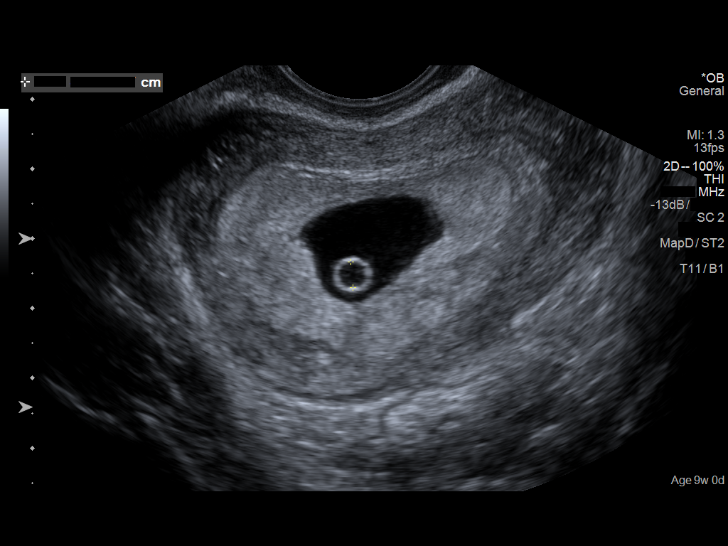
[im 49/89]
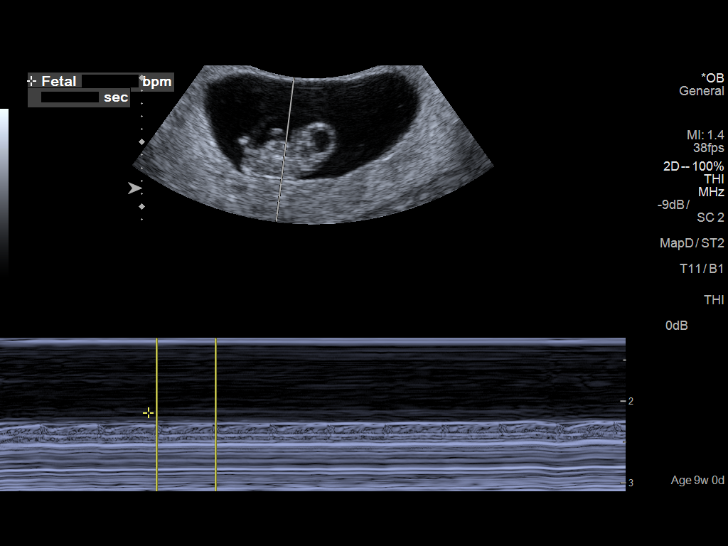
[im 56/89]
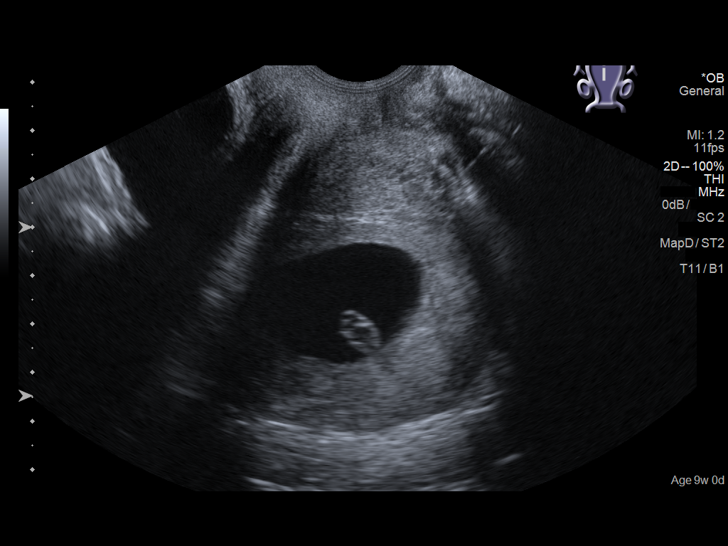
[im 62/89]
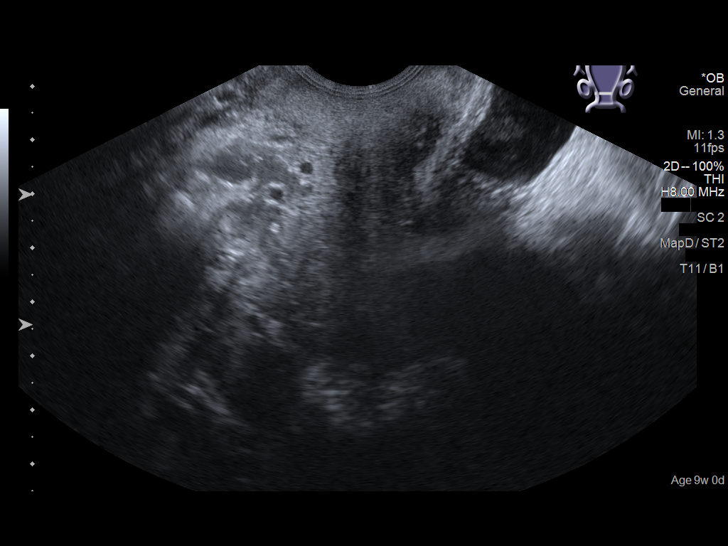
[im 69/89]
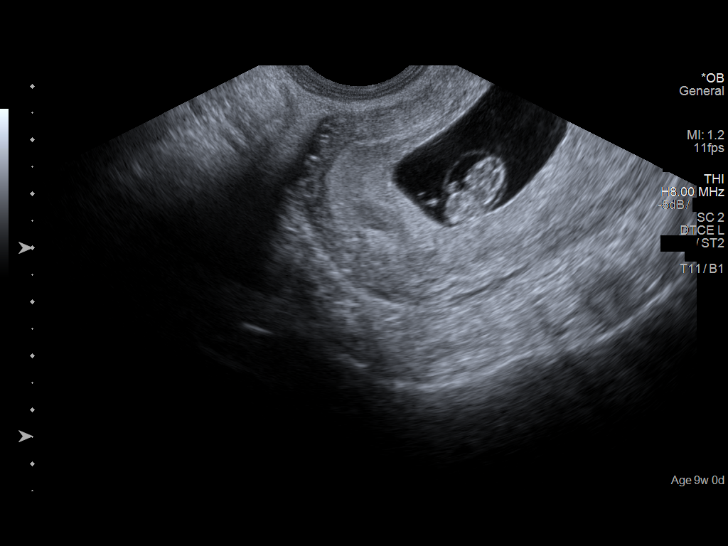
[im 75/89]
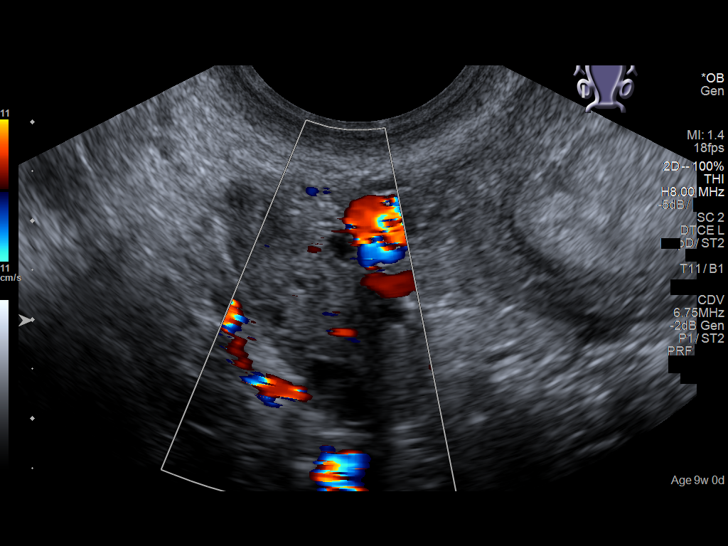
[im 82/89]
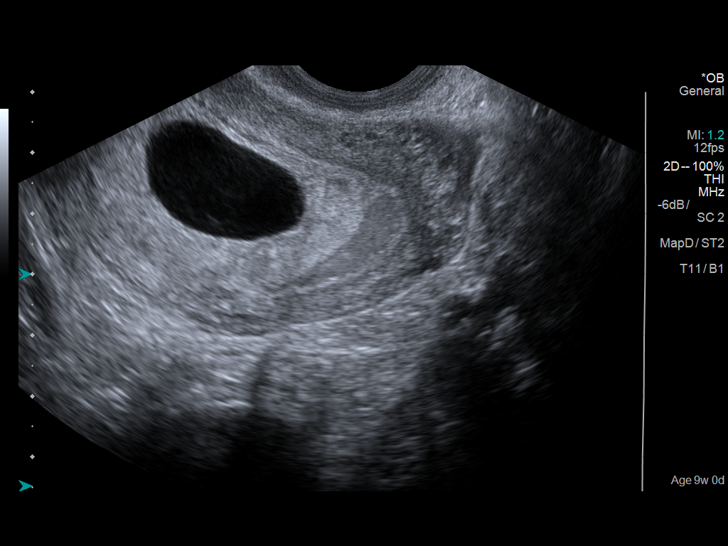
[im 89/89]
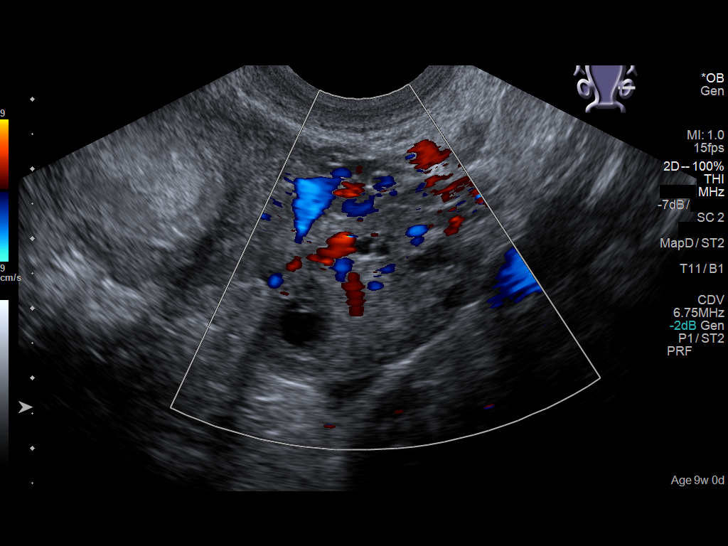

[14 of 28 positions shown; findings below may reference images not displayed]

FINDINGS: Intrauterine gestational sac: Single

Yolk sac:  Present

Embryo:  Present

Cardiac Activity: Present

Heart Rate: 173 bpm

CRL: 16.9 mm   8 w   1 d                  US EDC: 04/13/2019

Subchorionic hemorrhage:  None visualized.

Maternal uterus/adnexae: Right ovary unremarkable and normal in
appearance. Small corpus luteal cyst measuring up to 1.8 cm noted
within the left ovary. Left ovary otherwise unremarkable. No adnexal
mass. Trace free physiologic fluid noted within the pelvis.
IMPRESSION: 1. Single viable intrauterine pregnancy as above without
complication, estimated gestational age 8 weeks and 1 day by
crown-rump length, with ultrasound EDC of 04/13/2019.
2. No other acute maternal uterine or adnexal abnormality
identified.
